# Patient Record
Sex: Male | Born: 1935 | Race: White | Hispanic: No | Marital: Single | Smoking: Former smoker
Health system: Southern US, Community
[De-identification: ages and names within clinical notes are randomized; demographics above are authoritative.]

## PROBLEM LIST (undated history)

## (undated) DIAGNOSIS — N289 Disorder of kidney and ureter, unspecified: Secondary | ICD-10-CM

## (undated) DIAGNOSIS — I714 Abdominal aortic aneurysm, without rupture, unspecified: Secondary | ICD-10-CM

## (undated) DIAGNOSIS — K222 Esophageal obstruction: Secondary | ICD-10-CM

## (undated) DIAGNOSIS — I6529 Occlusion and stenosis of unspecified carotid artery: Secondary | ICD-10-CM

## (undated) DIAGNOSIS — Z720 Tobacco use: Secondary | ICD-10-CM

## (undated) DIAGNOSIS — N400 Enlarged prostate without lower urinary tract symptoms: Secondary | ICD-10-CM

## (undated) DIAGNOSIS — E079 Disorder of thyroid, unspecified: Secondary | ICD-10-CM

## (undated) DIAGNOSIS — N183 Chronic kidney disease, stage 3 unspecified: Secondary | ICD-10-CM

## (undated) DIAGNOSIS — Q231 Congenital insufficiency of aortic valve: Secondary | ICD-10-CM

## (undated) DIAGNOSIS — I219 Acute myocardial infarction, unspecified: Secondary | ICD-10-CM

## (undated) DIAGNOSIS — Q2381 Bicuspid aortic valve: Secondary | ICD-10-CM

## (undated) DIAGNOSIS — E785 Hyperlipidemia, unspecified: Secondary | ICD-10-CM

## (undated) DIAGNOSIS — I2510 Atherosclerotic heart disease of native coronary artery without angina pectoris: Secondary | ICD-10-CM

## (undated) DIAGNOSIS — I10 Essential (primary) hypertension: Secondary | ICD-10-CM

## (undated) HISTORY — DX: Chronic kidney disease, stage 3 unspecified: N18.30

## (undated) HISTORY — DX: Essential (primary) hypertension: I10

## (undated) HISTORY — PX: APPENDECTOMY: SHX54

## (undated) HISTORY — DX: Abdominal aortic aneurysm, without rupture, unspecified: I71.40

## (undated) HISTORY — DX: Tobacco use: Z72.0

## (undated) HISTORY — DX: Bicuspid aortic valve: Q23.81

## (undated) HISTORY — DX: Esophageal obstruction: K22.2

## (undated) HISTORY — PX: CARDIAC CATHETERIZATION: SHX172

## (undated) HISTORY — DX: Occlusion and stenosis of unspecified carotid artery: I65.29

## (undated) HISTORY — DX: Congenital insufficiency of aortic valve: Q23.1

## (undated) HISTORY — DX: Hyperlipidemia, unspecified: E78.5

## (undated) HISTORY — DX: Disorder of thyroid, unspecified: E07.9

## (undated) HISTORY — DX: Atherosclerotic heart disease of native coronary artery without angina pectoris: I25.10

## (undated) HISTORY — DX: Benign prostatic hyperplasia without lower urinary tract symptoms: N40.0

## (undated) HISTORY — DX: Chronic kidney disease, stage 3 (moderate): N18.3

## (undated) HISTORY — PX: CAROTID ENDARTERECTOMY: SUR193

## (undated) HISTORY — DX: Abdominal aortic aneurysm, without rupture: I71.4

## (undated) HISTORY — DX: Disorder of kidney and ureter, unspecified: N28.9

## (undated) HISTORY — DX: Acute myocardial infarction, unspecified: I21.9

---

## 1989-10-06 HISTORY — PX: CORONARY ARTERY BYPASS GRAFT: SHX141

## 2011-10-07 HISTORY — PX: CORONARY ANGIOPLASTY: SHX604

## 2012-08-17 DIAGNOSIS — N410 Acute prostatitis: Secondary | ICD-10-CM | POA: Diagnosis not present

## 2013-04-16 DIAGNOSIS — J449 Chronic obstructive pulmonary disease, unspecified: Secondary | ICD-10-CM | POA: Diagnosis not present

## 2013-04-16 DIAGNOSIS — I10 Essential (primary) hypertension: Secondary | ICD-10-CM | POA: Diagnosis not present

## 2013-04-16 DIAGNOSIS — I509 Heart failure, unspecified: Secondary | ICD-10-CM | POA: Diagnosis not present

## 2013-04-16 DIAGNOSIS — F17200 Nicotine dependence, unspecified, uncomplicated: Secondary | ICD-10-CM | POA: Diagnosis not present

## 2013-04-16 DIAGNOSIS — I4510 Unspecified right bundle-branch block: Secondary | ICD-10-CM | POA: Diagnosis not present

## 2013-04-16 DIAGNOSIS — N179 Acute kidney failure, unspecified: Secondary | ICD-10-CM | POA: Diagnosis not present

## 2013-04-16 DIAGNOSIS — I252 Old myocardial infarction: Secondary | ICD-10-CM | POA: Diagnosis not present

## 2013-04-16 DIAGNOSIS — Z951 Presence of aortocoronary bypass graft: Secondary | ICD-10-CM | POA: Diagnosis not present

## 2013-04-16 DIAGNOSIS — E785 Hyperlipidemia, unspecified: Secondary | ICD-10-CM | POA: Diagnosis not present

## 2013-04-16 DIAGNOSIS — Z79899 Other long term (current) drug therapy: Secondary | ICD-10-CM | POA: Diagnosis not present

## 2013-04-16 DIAGNOSIS — R918 Other nonspecific abnormal finding of lung field: Secondary | ICD-10-CM | POA: Diagnosis not present

## 2013-04-16 DIAGNOSIS — N390 Urinary tract infection, site not specified: Secondary | ICD-10-CM | POA: Diagnosis not present

## 2014-02-23 DIAGNOSIS — R5381 Other malaise: Secondary | ICD-10-CM | POA: Diagnosis not present

## 2014-02-23 DIAGNOSIS — Z9181 History of falling: Secondary | ICD-10-CM | POA: Diagnosis not present

## 2014-02-23 DIAGNOSIS — I10 Essential (primary) hypertension: Secondary | ICD-10-CM | POA: Diagnosis not present

## 2014-02-28 DIAGNOSIS — Z9181 History of falling: Secondary | ICD-10-CM | POA: Diagnosis not present

## 2014-02-28 DIAGNOSIS — R5381 Other malaise: Secondary | ICD-10-CM | POA: Diagnosis not present

## 2014-02-28 DIAGNOSIS — I10 Essential (primary) hypertension: Secondary | ICD-10-CM | POA: Diagnosis not present

## 2014-03-02 DIAGNOSIS — R5381 Other malaise: Secondary | ICD-10-CM | POA: Diagnosis not present

## 2014-03-02 DIAGNOSIS — Z9181 History of falling: Secondary | ICD-10-CM | POA: Diagnosis not present

## 2014-03-02 DIAGNOSIS — I10 Essential (primary) hypertension: Secondary | ICD-10-CM | POA: Diagnosis not present

## 2014-03-07 DIAGNOSIS — I10 Essential (primary) hypertension: Secondary | ICD-10-CM | POA: Diagnosis not present

## 2014-03-07 DIAGNOSIS — R5381 Other malaise: Secondary | ICD-10-CM | POA: Diagnosis not present

## 2014-03-07 DIAGNOSIS — Z9181 History of falling: Secondary | ICD-10-CM | POA: Diagnosis not present

## 2014-03-09 DIAGNOSIS — R5381 Other malaise: Secondary | ICD-10-CM | POA: Diagnosis not present

## 2014-03-09 DIAGNOSIS — Z9181 History of falling: Secondary | ICD-10-CM | POA: Diagnosis not present

## 2014-03-09 DIAGNOSIS — I10 Essential (primary) hypertension: Secondary | ICD-10-CM | POA: Diagnosis not present

## 2014-03-14 DIAGNOSIS — Z9181 History of falling: Secondary | ICD-10-CM | POA: Diagnosis not present

## 2014-03-14 DIAGNOSIS — I10 Essential (primary) hypertension: Secondary | ICD-10-CM | POA: Diagnosis not present

## 2014-03-14 DIAGNOSIS — R5381 Other malaise: Secondary | ICD-10-CM | POA: Diagnosis not present

## 2014-03-16 DIAGNOSIS — I10 Essential (primary) hypertension: Secondary | ICD-10-CM | POA: Diagnosis not present

## 2014-03-16 DIAGNOSIS — R5381 Other malaise: Secondary | ICD-10-CM | POA: Diagnosis not present

## 2014-03-16 DIAGNOSIS — Z9181 History of falling: Secondary | ICD-10-CM | POA: Diagnosis not present

## 2014-03-21 DIAGNOSIS — I10 Essential (primary) hypertension: Secondary | ICD-10-CM | POA: Diagnosis not present

## 2014-03-21 DIAGNOSIS — Z9181 History of falling: Secondary | ICD-10-CM | POA: Diagnosis not present

## 2014-03-21 DIAGNOSIS — R5381 Other malaise: Secondary | ICD-10-CM | POA: Diagnosis not present

## 2014-03-23 DIAGNOSIS — Z9181 History of falling: Secondary | ICD-10-CM | POA: Diagnosis not present

## 2014-03-23 DIAGNOSIS — I10 Essential (primary) hypertension: Secondary | ICD-10-CM | POA: Diagnosis not present

## 2014-03-23 DIAGNOSIS — R5381 Other malaise: Secondary | ICD-10-CM | POA: Diagnosis not present

## 2014-03-28 DIAGNOSIS — Z9181 History of falling: Secondary | ICD-10-CM | POA: Diagnosis not present

## 2014-03-28 DIAGNOSIS — I10 Essential (primary) hypertension: Secondary | ICD-10-CM | POA: Diagnosis not present

## 2014-03-28 DIAGNOSIS — R5381 Other malaise: Secondary | ICD-10-CM | POA: Diagnosis not present

## 2014-03-31 DIAGNOSIS — Z9181 History of falling: Secondary | ICD-10-CM | POA: Diagnosis not present

## 2014-03-31 DIAGNOSIS — I10 Essential (primary) hypertension: Secondary | ICD-10-CM | POA: Diagnosis not present

## 2014-03-31 DIAGNOSIS — R5381 Other malaise: Secondary | ICD-10-CM | POA: Diagnosis not present

## 2014-09-21 ENCOUNTER — Encounter: Payer: Self-pay | Admitting: Cardiovascular Disease

## 2014-09-21 ENCOUNTER — Inpatient Hospital Stay: Payer: Self-pay | Admitting: Cardiovascular Disease

## 2014-09-21 ENCOUNTER — Other Ambulatory Visit: Payer: Self-pay | Admitting: Physician Assistant

## 2014-09-21 DIAGNOSIS — I2510 Atherosclerotic heart disease of native coronary artery without angina pectoris: Secondary | ICD-10-CM | POA: Diagnosis present

## 2014-09-21 DIAGNOSIS — Z66 Do not resuscitate: Secondary | ICD-10-CM | POA: Diagnosis not present

## 2014-09-21 DIAGNOSIS — I129 Hypertensive chronic kidney disease with stage 1 through stage 4 chronic kidney disease, or unspecified chronic kidney disease: Secondary | ICD-10-CM | POA: Diagnosis not present

## 2014-09-21 DIAGNOSIS — I350 Nonrheumatic aortic (valve) stenosis: Secondary | ICD-10-CM

## 2014-09-21 DIAGNOSIS — I2111 ST elevation (STEMI) myocardial infarction involving right coronary artery: Secondary | ICD-10-CM | POA: Diagnosis not present

## 2014-09-21 DIAGNOSIS — Z720 Tobacco use: Secondary | ICD-10-CM | POA: Diagnosis not present

## 2014-09-21 DIAGNOSIS — I2119 ST elevation (STEMI) myocardial infarction involving other coronary artery of inferior wall: Secondary | ICD-10-CM | POA: Diagnosis not present

## 2014-09-21 DIAGNOSIS — Z716 Tobacco abuse counseling: Secondary | ICD-10-CM | POA: Diagnosis not present

## 2014-09-21 DIAGNOSIS — N400 Enlarged prostate without lower urinary tract symptoms: Secondary | ICD-10-CM | POA: Diagnosis present

## 2014-09-21 DIAGNOSIS — I213 ST elevation (STEMI) myocardial infarction of unspecified site: Secondary | ICD-10-CM | POA: Diagnosis not present

## 2014-09-21 DIAGNOSIS — I714 Abdominal aortic aneurysm, without rupture: Secondary | ICD-10-CM | POA: Diagnosis present

## 2014-09-21 DIAGNOSIS — Z8249 Family history of ischemic heart disease and other diseases of the circulatory system: Secondary | ICD-10-CM | POA: Diagnosis not present

## 2014-09-21 DIAGNOSIS — Z7982 Long term (current) use of aspirin: Secondary | ICD-10-CM | POA: Diagnosis not present

## 2014-09-21 DIAGNOSIS — I25810 Atherosclerosis of coronary artery bypass graft(s) without angina pectoris: Secondary | ICD-10-CM | POA: Diagnosis present

## 2014-09-21 DIAGNOSIS — F17210 Nicotine dependence, cigarettes, uncomplicated: Secondary | ICD-10-CM | POA: Diagnosis present

## 2014-09-21 DIAGNOSIS — E039 Hypothyroidism, unspecified: Secondary | ICD-10-CM | POA: Diagnosis not present

## 2014-09-21 DIAGNOSIS — I10 Essential (primary) hypertension: Secondary | ICD-10-CM

## 2014-09-21 DIAGNOSIS — N183 Chronic kidney disease, stage 3 (moderate): Secondary | ICD-10-CM | POA: Diagnosis not present

## 2014-09-21 DIAGNOSIS — Z01810 Encounter for preprocedural cardiovascular examination: Secondary | ICD-10-CM | POA: Diagnosis not present

## 2014-09-21 DIAGNOSIS — E785 Hyperlipidemia, unspecified: Secondary | ICD-10-CM | POA: Diagnosis not present

## 2014-09-21 LAB — CBC WITH DIFFERENTIAL/PLATELET
BASOS PCT: 0.7 %
Basophil #: 0.1 10*3/uL (ref 0.0–0.1)
EOS ABS: 0.1 10*3/uL (ref 0.0–0.7)
Eosinophil %: 1.6 %
HCT: 40.9 % (ref 40.0–52.0)
HGB: 13.4 g/dL (ref 13.0–18.0)
Lymphocyte #: 1.4 10*3/uL (ref 1.0–3.6)
Lymphocyte %: 18 %
MCH: 31.4 pg (ref 26.0–34.0)
MCHC: 32.9 g/dL (ref 32.0–36.0)
MCV: 96 fL (ref 80–100)
Monocyte #: 0.5 x10 3/mm (ref 0.2–1.0)
Monocyte %: 6 %
NEUTROS PCT: 73.7 %
Neutrophil #: 5.9 10*3/uL (ref 1.4–6.5)
PLATELETS: 162 10*3/uL (ref 150–440)
RBC: 4.28 10*6/uL — AB (ref 4.40–5.90)
RDW: 13.8 % (ref 11.5–14.5)
WBC: 8 10*3/uL (ref 3.8–10.6)

## 2014-09-21 LAB — TROPONIN I
Troponin-I: 0.48 ng/mL — ABNORMAL HIGH
Troponin-I: 13 ng/mL — ABNORMAL HIGH
Troponin-I: 17 ng/mL — ABNORMAL HIGH

## 2014-09-21 LAB — CK TOTAL AND CKMB (NOT AT ARMC)
CK, Total: 47 U/L (ref 39–308)
CK, Total: 298 U/L (ref 39–308)
CK, Total: 372 U/L — ABNORMAL HIGH (ref 39–308)
CK-MB: 2.9 ng/mL (ref 0.5–3.6)
CK-MB: 32 ng/mL — ABNORMAL HIGH (ref 0.5–3.6)
CK-MB: 43.3 ng/mL — AB (ref 0.5–3.6)

## 2014-09-21 LAB — CK-MB: CK-MB: 38.4 ng/mL — AB (ref 0.5–3.6)

## 2014-09-21 LAB — BASIC METABOLIC PANEL
ANION GAP: 11 (ref 7–16)
BUN: 31 mg/dL — ABNORMAL HIGH (ref 7–18)
CALCIUM: 9.1 mg/dL (ref 8.5–10.1)
CHLORIDE: 105 mmol/L (ref 98–107)
Co2: 23 mmol/L (ref 21–32)
Creatinine: 1.78 mg/dL — ABNORMAL HIGH (ref 0.60–1.30)
EGFR (African American): 47 — ABNORMAL LOW
EGFR (Non-African Amer.): 39 — ABNORMAL LOW
Glucose: 128 mg/dL — ABNORMAL HIGH (ref 65–99)
Osmolality: 286 (ref 275–301)
Potassium: 4.1 mmol/L (ref 3.5–5.1)
Sodium: 139 mmol/L (ref 136–145)

## 2014-09-21 LAB — HEMOGLOBIN A1C: HEMOGLOBIN A1C: 5.1 % (ref 4.2–6.3)

## 2014-09-22 ENCOUNTER — Encounter: Payer: Self-pay | Admitting: Cardiovascular Disease

## 2014-09-22 LAB — BASIC METABOLIC PANEL
Anion Gap: 7 (ref 7–16)
BUN: 30 mg/dL — ABNORMAL HIGH (ref 7–18)
CO2: 26 mmol/L (ref 21–32)
CREATININE: 1.54 mg/dL — AB (ref 0.60–1.30)
Calcium, Total: 7.8 mg/dL — ABNORMAL LOW (ref 8.5–10.1)
Chloride: 106 mmol/L (ref 98–107)
EGFR (African American): 56 — ABNORMAL LOW
EGFR (Non-African Amer.): 46 — ABNORMAL LOW
Glucose: 100 mg/dL — ABNORMAL HIGH (ref 65–99)
Osmolality: 284 (ref 275–301)
Potassium: 3.9 mmol/L (ref 3.5–5.1)
Sodium: 139 mmol/L (ref 136–145)

## 2014-09-22 LAB — LIPID PANEL
CHOLESTEROL: 119 mg/dL (ref 0–200)
HDL: 32 mg/dL — AB (ref 40–60)
LDL CHOLESTEROL, CALC: 68 mg/dL (ref 0–100)
Triglycerides: 97 mg/dL (ref 0–200)
VLDL Cholesterol, Calc: 19 mg/dL (ref 5–40)

## 2014-09-22 LAB — CK-MB: CK-MB: 54 ng/mL — AB (ref 0.5–3.6)

## 2014-09-25 ENCOUNTER — Telehealth: Payer: Self-pay

## 2014-09-25 ENCOUNTER — Encounter: Payer: Self-pay | Admitting: Cardiovascular Disease

## 2014-09-25 NOTE — Telephone Encounter (Signed)
Pt daughter called, states she had a question regarding pt BP medication that he received when he was discharged.

## 2014-09-25 NOTE — Telephone Encounter (Signed)
Patient contacted regarding discharge from [facility] on [facility]1914.  Patient understands to follow up with Eula Listenyan Dunn, PA on 10/09/14 at 1:45 at [facility]. Patient understands discharge instructions? yes Patient understands medications and regiment? yes Patient understands to bring all medications to this visit? yes  Pt's daughter wanted to clarify pt's lisinopril dose, as the bottle says once daily, but her d/c instructions say every other day.  Advised her that the discharge summary that I see indicates QD dosing, but to monitor pt's BP. She is agreeable to this and will keep scheduled appt w/ Ryan.

## 2014-09-25 NOTE — Telephone Encounter (Signed)
Daughter's name is Shyrl NumbersJennifer Hurst

## 2014-09-27 ENCOUNTER — Encounter: Payer: Self-pay | Admitting: Cardiovascular Disease

## 2014-10-09 ENCOUNTER — Ambulatory Visit (INDEPENDENT_AMBULATORY_CARE_PROVIDER_SITE_OTHER): Payer: Medicare Other | Admitting: Physician Assistant

## 2014-10-09 ENCOUNTER — Ambulatory Visit: Payer: Self-pay | Admitting: Physician Assistant

## 2014-10-09 ENCOUNTER — Encounter: Payer: Self-pay | Admitting: Physician Assistant

## 2014-10-09 VITALS — BP 184/78 | HR 54 | Ht 64.0 in | Wt 141.2 lb

## 2014-10-09 DIAGNOSIS — I10 Essential (primary) hypertension: Secondary | ICD-10-CM | POA: Diagnosis not present

## 2014-10-09 DIAGNOSIS — N183 Chronic kidney disease, stage 3 unspecified: Secondary | ICD-10-CM

## 2014-10-09 DIAGNOSIS — I25810 Atherosclerosis of coronary artery bypass graft(s) without angina pectoris: Secondary | ICD-10-CM | POA: Diagnosis not present

## 2014-10-09 DIAGNOSIS — I872 Venous insufficiency (chronic) (peripheral): Secondary | ICD-10-CM | POA: Diagnosis not present

## 2014-10-09 DIAGNOSIS — K222 Esophageal obstruction: Secondary | ICD-10-CM | POA: Insufficient documentation

## 2014-10-09 DIAGNOSIS — Z9889 Other specified postprocedural states: Secondary | ICD-10-CM | POA: Diagnosis not present

## 2014-10-09 DIAGNOSIS — I714 Abdominal aortic aneurysm, without rupture, unspecified: Secondary | ICD-10-CM

## 2014-10-09 DIAGNOSIS — E785 Hyperlipidemia, unspecified: Secondary | ICD-10-CM | POA: Insufficient documentation

## 2014-10-09 DIAGNOSIS — I6529 Occlusion and stenosis of unspecified carotid artery: Secondary | ICD-10-CM | POA: Insufficient documentation

## 2014-10-09 DIAGNOSIS — I6522 Occlusion and stenosis of left carotid artery: Secondary | ICD-10-CM

## 2014-10-09 DIAGNOSIS — I2510 Atherosclerotic heart disease of native coronary artery without angina pectoris: Secondary | ICD-10-CM | POA: Insufficient documentation

## 2014-10-09 DIAGNOSIS — Z720 Tobacco use: Secondary | ICD-10-CM | POA: Insufficient documentation

## 2014-10-09 DIAGNOSIS — Q231 Congenital insufficiency of aortic valve: Secondary | ICD-10-CM | POA: Insufficient documentation

## 2014-10-09 LAB — BASIC METABOLIC PANEL
ANION GAP: 6 — AB (ref 7–16)
BUN: 30 mg/dL — ABNORMAL HIGH (ref 7–18)
CALCIUM: 9 mg/dL (ref 8.5–10.1)
CHLORIDE: 111 mmol/L — AB (ref 98–107)
CO2: 21 mmol/L (ref 21–32)
Creatinine: 1.42 mg/dL — ABNORMAL HIGH (ref 0.60–1.30)
EGFR (Non-African Amer.): 51 — ABNORMAL LOW
Glucose: 91 mg/dL (ref 65–99)
OSMOLALITY: 281 (ref 275–301)
Potassium: 4.9 mmol/L (ref 3.5–5.1)
Sodium: 138 mmol/L (ref 136–145)

## 2014-10-09 MED ORDER — AMLODIPINE BESYLATE 10 MG PO TABS: 10.0000 mg | ORAL_TABLET | Freq: Every day | ORAL | Status: DC

## 2014-10-09 MED ORDER — LISINOPRIL 5 MG PO TABS: 5.0000 mg | ORAL_TABLET | Freq: Every day | ORAL | Status: DC

## 2014-10-09 MED ORDER — CARVEDILOL 3.125 MG PO TABS: 3.1250 mg | ORAL_TABLET | Freq: Two times a day (BID) | ORAL | Status: DC

## 2014-10-09 MED ORDER — CLOPIDOGREL BISULFATE 75 MG PO TABS: 75.0000 mg | ORAL_TABLET | Freq: Every day | ORAL | Status: AC

## 2014-10-09 MED ORDER — AMLODIPINE BESYLATE 5 MG PO TABS: 5.0000 mg | ORAL_TABLET | Freq: Every day | ORAL | Status: DC

## 2014-10-09 MED ORDER — NITROGLYCERIN 0.4 MG SL SUBL: 0.4000 mg | SUBLINGUAL_TABLET | SUBLINGUAL | Status: AC | PRN

## 2014-10-09 NOTE — Progress Notes (Signed)
Patient Name: Daniel Huerta, [name] Dec 26, 1928, MRN [id number]  Date of Encounter: 10/09/2014  Primary Care Provider:  [facility] in [place], [place]  Primary Cardiologist:  Dr. Kirke Corin, MD  Patient Profile:  81 y.o. male with history below presents for hospital follow up after recent admission to [facility] from 12/17-12/19 for inferior STEMI s/p PCI/DES x2 to distal third of the saphenous vein graft from the aorta to the distal RCA and middle third of the saphenous vein graft from the aorta to the distal RCA.    Problem List:   Past Medical History  Diagnosis Date  . AAA (abdominal aortic aneurysm)     a. 4.4 x 4.5 cm on 04/27/2014  . Hypertension   . Hyperlipidemia   . BPH (benign prostatic hypertrophy)   . Coronary artery disease     a. 2v CABG 1991 (LIMA-LAD, SVG-RCA), stenting LCx 2013; b. inferior STEMI 09/21/2014 cath:  LIMA-LAD is atretic w/ 90% ost LAD, acute occlusion SVG-RCA s/p PCI/DES x 2, patent LCx stent   . Acute renal insufficiency   . Tobacco abuse   . CKD (chronic kidney disease), stage III   . Bicuspid aortic valve     a. echo 09/21/14: EF 55-60%, Antseptal wall motion abn 2/2 prior CABG, images suggestive of bicuspid aortic valve, mild dilation of aortic root (3.6 cm) & ascending aorta, mild MR, impaired relaxation  . MI (myocardial infarction)     x3  . Thyroid disease   . Carotid artery occlusion   . Esophageal stricture     a. s/p multiple dilatations   Past Surgical History  Procedure Laterality Date  . Coronary artery bypass graft  1991    CABG x 2   . Carotid endarterectomy    . Cardiac catheterization    . Coronary angioplasty  2013    s/p stenting to LCX    . Cardiac catheterization      x2 [facility] [place]  . Cardiac catheterization      x2 [facility] [place]  . Appendectomy       Allergies:  Allergies  Allergen Reactions  . Cerivastatin   . Morphine     Other reaction(s): Other - See  Comments Causes hiccups     HPI:  81 y.o. male with the above problem list presents for hospital follow up. Patient is with history of CAD s/p 2 vessel CABG in 1991 (LIMA-LAD & SVG-RCA) s/p stenting to LCx in 2013. Records are not on hand for review as his primary cardiologist is through the [facility]. He also reports having a AAA which measures approximately 4.4 x 4.5 cm on 04/27/2014. He is in town from [place], [place] visiting his daughter. He was in his usual state of health on the morning of 09/21/2014 sitting down relaxing until he suddenly developed substernal chest pain that radiated to his left arm and wrist. No associated diaphoresis, nausea, vomting, SOB, presyncope, or syncope. He took 3 SL NTG without any help in symptoms.   His daughter brought him to [facility] Upon his arrival to [facility] he was noted to have an inferior ST elevation MI. EKG with with NSR, 65, inferior ST elevation with reciprocal changes, PVC and fusion complexes. Troponin peak of 17.00. He was taken emergently to the cardiac cath lab where Dr. Kirke Corin performed cardiac catheterization which showed 90% atretic ostial LAD stenosis, patent LCx stent, and acute occluded SVG-->RCA s/p PCI/DES x 2. He did well post  cath, without any issues. He is a fairly active guy at baseline. No recent weight gain. No orthopnea. No early satiety.   He has felt well since his discharge. He has taken it easy at his daughter's house, not doing any activities. No chest pain, chest tightness, or SOB. He does eat a large amount of salt on all of his foods. His family has had multiple discussions with him regarding this, though he continues to place large amounts of salt on his foods. He does not drink much water daily. His weight has been stable for the most part. Some swelling the ankles, though this is not new. He is taking his medications as directed. He has not been checking his BP at home until this morning when it was 140/65 with a pulse of 58. He  felt fine. He wants to go back to his home in [place], [place]. He does not have a cardiologist there.    Home Medications:  Prior to Admission medications   Medication Sig Start Date End Date Taking? Authorizing Provider  amLODipine (NORVASC) 5 MG tablet Take 5 mg by mouth daily.    Historical Provider, MD  aspirin 81 MG tablet Take 81 mg by mouth daily.    Historical Provider, MD  carvedilol (COREG) 3.125 MG tablet Take 3.125 mg by mouth 2 (two) times daily with a meal.    Historical Provider, MD  levothyroxine (SYNTHROID, LEVOTHROID) 25 MCG tablet Take 25 mcg by mouth daily before breakfast.    Historical Provider, MD  lisinopril (PRINIVIL,ZESTRIL) 5 MG tablet Take 5 mg by mouth daily.    Historical Provider, MD  nitroGLYCERIN (NITROSTAT) 0.4 MG SL tablet Place 0.4 mg under the tongue every 5 (five) minutes as needed for chest pain.    Historical Provider, MD  pravastatin (PRAVACHOL) 40 MG tablet Take 40 mg by mouth daily.    Historical Provider, MD  terazosin (HYTRIN) 2 MG capsule Take 2 mg by mouth at bedtime.    Historical Provider, MD  ticagrelor (BRILINTA) 90 MG TABS tablet Take by mouth 2 (two) times daily.    Historical Provider, MD  traZODone (DESYREL) 50 MG tablet Take 50 mg by mouth at bedtime.    Historical Provider, MD     Weights: Filed Weights   10/09/14 1355  Weight: 141 lb 4 oz (64.071 kg)     Review of Systems:  As above. All other systems reviewed and are otherwise negative except as noted above.  Physical Exam:  Blood pressure 184/78, pulse 54, height  (1.626 m), weight 141 lb 4 oz (64.071 kg).  General: Pleasant, NAD Psych: Normal affect. Neuro: Alert and oriented X 3. Moves all extremities spontaneously. HEENT: Normal  Neck: Supple without bruits or JVD. Lungs:  Resp regular and unlabored, CTA. Heart: RRR no s3, s4, or murmurs. Abdomen: Soft, non-tender, non-distended, BS + x 4.  Extremities: No clubbing, cyanosis, 2+ pitting edema to just  below the bilateral knees with mildly erythematous rash.    Accessory Clinical Findings:  EKG - sinus bradycardia with sinus arrhythmia, 54 bpm, LVH, normal axis, st/t changes c/w prior inferior infarct   Assessment & Plan:  1. CAD s/p 2 vessel CABG (LIMA-LAD, SVG-RCA), s/p LCx stenting 2013, with history of inferior STEMI 09/21/2014 s/p PCI/DES x 2 to SVG-RCA as above with residual ostial LAD disease: -Continue aspirin and Brilinta for at least 12 months (may need to change to Plavix) -Schedule outpatient [facility] for planned PCI of ostial LAD stenosis  -  Continue Coreg 3.125  -Lipitor 40 mg  2. HTN: -Reported history of labile HTN -BP at home this morning was 140/65 -Increase amlodipine to 10 mg daily  -Continue lisinopril 5 mg, Coreg 3.125 mg bid, and clonidine 0.1 mg bid  3. History of acute on CKD stage III: -Improved with hydration while inpatient -Check bmet to verify SCr is stable/improved (1.54 at discharge)   4. HLD: -TC 119, LDL 68, HDL 32 TG 97 -Continue Lipitor 40 mg  5. AAA: -Prior study from 04/27/2014 indicated AAA measured at 4.4 x 4.5 cm (no prior to compare to) -Recommend repeat study to trend  6. Venous insufficiency: -Patient eats large amounts of salt, discussed with him in detail this must stop and he agrees to do so. Should he make this lifestyle adjustment and his lower extremity edema persist would adjust his amlodipine (however given his labile HTN and massive salt consumption I felt this was the better option initially). -Compression hose -Lasix 20 mg daily -Bmet as above  7. Dispo: -Follow up 1 month -Plan for outpatient cardiac cath with Dr. Kirke Corin, possibly in 1 week   Eula Listen, PA-C [facility] [address] [place], [place] [address] [phone] [facility] 10/09/2014, 3:59 PM

## 2014-10-09 NOTE — Patient Instructions (Signed)
We will check labs today:  BMET  Please increase your amlodipine to 10 mg once a day  Please start Plavix 75 mg once a day  Your physician has requested that you have an abdominal aorta duplex. During this test, an ultrasound is used to evaluate the aorta. Allow 30 minutes for this exam. Do not eat after midnight the day before and avoid carbonated beverages  Your physician recommends that you schedule a follow-up appointment in: 1 month

## 2014-10-10 ENCOUNTER — Telehealth: Payer: Self-pay | Admitting: Physician Assistant

## 2014-10-10 NOTE — Telephone Encounter (Signed)
Pt granddaughter called asking about blood work. Please call patient with those.

## 2014-10-10 NOTE — Telephone Encounter (Signed)
Results in Ryan's basket for review.  Please see result note.

## 2014-10-11 ENCOUNTER — Encounter: Payer: Self-pay | Admitting: Physician Assistant

## 2014-10-11 ENCOUNTER — Telehealth: Payer: Self-pay | Admitting: *Deleted

## 2014-10-11 DIAGNOSIS — I10 Essential (primary) hypertension: Secondary | ICD-10-CM

## 2014-10-11 DIAGNOSIS — Z01812 Encounter for preprocedural laboratory examination: Secondary | ICD-10-CM

## 2014-10-11 MED ORDER — FUROSEMIDE 20 MG PO TABS: 20.0000 mg | ORAL_TABLET | Freq: Every day | ORAL | Status: AC

## 2014-10-11 NOTE — Telephone Encounter (Signed)
Error

## 2014-10-11 NOTE — Telephone Encounter (Signed)
Informed patients daughter of Daniel HatchetRyan Dunns response  Scheduled appt for labs  PAtients daughter verbalized understanding

## 2014-10-11 NOTE — Telephone Encounter (Signed)
-----   Message from Sondra Bargesyan M Dunn, [place] sent at 10/11/2014  4:17 PM EST ----- Looks like his Lasix didn't make it to the pharmacy Let's have him take Lasix 20 mg bid for the next 3 days, followed by 20 mg daily with recheck pre-cath labs on Monday in hopes that he will be doing better at that time and we can schedule cath then

## 2014-10-16 ENCOUNTER — Other Ambulatory Visit (INDEPENDENT_AMBULATORY_CARE_PROVIDER_SITE_OTHER): Payer: Medicare Other

## 2014-10-16 DIAGNOSIS — Q231 Congenital insufficiency of aortic valve: Secondary | ICD-10-CM

## 2014-10-16 DIAGNOSIS — I10 Essential (primary) hypertension: Secondary | ICD-10-CM

## 2014-10-16 DIAGNOSIS — Z01812 Encounter for preprocedural laboratory examination: Secondary | ICD-10-CM

## 2014-10-16 DIAGNOSIS — I714 Abdominal aortic aneurysm, without rupture: Secondary | ICD-10-CM | POA: Diagnosis not present

## 2014-10-17 ENCOUNTER — Telehealth: Payer: Self-pay | Admitting: *Deleted

## 2014-10-17 LAB — BASIC METABOLIC PANEL
BUN/Creatinine Ratio: 24 — ABNORMAL HIGH (ref 10–22)
BUN: 48 mg/dL — AB (ref 8–27)
CALCIUM: 8.6 mg/dL (ref 8.6–10.2)
CO2: 20 mmol/L (ref 18–29)
Chloride: 102 mmol/L (ref 97–108)
Creatinine, Ser: 1.98 mg/dL — ABNORMAL HIGH (ref 0.76–1.27)
GFR, EST AFRICAN AMERICAN: 35 mL/min/{1.73_m2} — AB (ref >59)
GFR, EST NON AFRICAN AMERICAN: 30 mL/min/{1.73_m2} — AB (ref >59)
GLUCOSE: 123 mg/dL — AB (ref 65–99)
POTASSIUM: 4.8 mmol/L (ref 3.5–5.2)
Sodium: 138 mmol/L (ref 134–144)

## 2014-10-17 LAB — CBC WITH DIFFERENTIAL
BASOS ABS: 0.1 10*3/uL (ref 0.0–0.2)
Basos: 1 %
Eos: 5 %
Eosinophils Absolute: 0.3 10*3/uL (ref 0.0–0.4)
HCT: 33.1 % — ABNORMAL LOW (ref 37.5–51.0)
HEMOGLOBIN: 11.6 g/dL — AB (ref 12.6–17.7)
Immature Grans (Abs): 0 10*3/uL (ref 0.0–0.1)
Immature Granulocytes: 0 %
Lymphocytes Absolute: 1 10*3/uL (ref 0.7–3.1)
Lymphs: 16 %
MCH: 31.6 pg (ref 26.6–33.0)
MCHC: 35 g/dL (ref 31.5–35.7)
MCV: 90 fL (ref 79–97)
MONOCYTES: 9 %
Monocytes Absolute: 0.6 10*3/uL (ref 0.1–0.9)
NEUTROS ABS: 4.3 10*3/uL (ref 1.4–7.0)
Neutrophils Relative %: 69 %
Platelets: 142 10*3/uL — ABNORMAL LOW (ref 150–379)
RBC: 3.67 x10E6/uL — ABNORMAL LOW (ref 4.14–5.80)
RDW: 13.9 % (ref 12.3–15.4)
WBC: 6.3 10*3/uL (ref 3.4–10.8)

## 2014-10-17 LAB — PROTIME-INR
INR: 1 (ref 0.8–1.2)
Prothrombin Time: 10.7 s (ref 9.1–12.0)

## 2014-10-17 NOTE — Telephone Encounter (Signed)
Informed patients daughter that creatinine is elevated  Per Dr. Kirke CorinArida we can not do cath tomorrow  F/u early next week

## 2014-10-17 NOTE — Telephone Encounter (Signed)
-----   Message from Sondra Bargesyan M Dunn, PA-C sent at 10/17/2014 11:11 AM EST ----- Please call the patient. -SCr has worsened (1.42-->1.98) -Please contact Dr. Kirke CorinArida and ask if he is ok proceeding with cath [facility] or if he would like to reschedule

## 2014-10-18 NOTE — Telephone Encounter (Signed)
Called patient spoke to [place] pt daughter and scheduled an apt Monday 18th at 2:45.

## 2014-10-19 ENCOUNTER — Encounter: Payer: Self-pay | Admitting: Physician Assistant

## 2014-10-19 ENCOUNTER — Encounter (INDEPENDENT_AMBULATORY_CARE_PROVIDER_SITE_OTHER): Payer: Medicare Other

## 2014-10-19 DIAGNOSIS — I10 Essential (primary) hypertension: Secondary | ICD-10-CM

## 2014-10-19 DIAGNOSIS — I714 Abdominal aortic aneurysm, without rupture, unspecified: Secondary | ICD-10-CM

## 2014-10-19 DIAGNOSIS — N183 Chronic kidney disease, stage 3 (moderate): Secondary | ICD-10-CM | POA: Diagnosis not present

## 2014-10-19 DIAGNOSIS — I700 Atherosclerosis of aorta: Secondary | ICD-10-CM

## 2014-10-23 ENCOUNTER — Telehealth: Payer: Self-pay | Admitting: *Deleted

## 2014-10-23 ENCOUNTER — Encounter: Payer: Self-pay | Admitting: Cardiovascular Disease

## 2014-10-23 ENCOUNTER — Ambulatory Visit (INDEPENDENT_AMBULATORY_CARE_PROVIDER_SITE_OTHER): Payer: Medicare Other | Admitting: Cardiovascular Disease

## 2014-10-23 VITALS — BP 130/70 | HR 60 | Ht 64.0 in | Wt 138.2 lb

## 2014-10-23 DIAGNOSIS — I714 Abdominal aortic aneurysm, without rupture, unspecified: Secondary | ICD-10-CM

## 2014-10-23 DIAGNOSIS — I6522 Occlusion and stenosis of left carotid artery: Secondary | ICD-10-CM

## 2014-10-23 DIAGNOSIS — I10 Essential (primary) hypertension: Secondary | ICD-10-CM

## 2014-10-23 DIAGNOSIS — I2510 Atherosclerotic heart disease of native coronary artery without angina pectoris: Secondary | ICD-10-CM | POA: Diagnosis not present

## 2014-10-23 MED ORDER — AMLODIPINE BESYLATE 5 MG PO TABS: 5.0000 mg | ORAL_TABLET | Freq: Every day | ORAL | Status: AC

## 2014-10-23 MED ORDER — LISINOPRIL 5 MG PO TABS: 5.0000 mg | ORAL_TABLET | Freq: Every day | ORAL | Status: AC

## 2014-10-23 MED ORDER — CARVEDILOL 3.125 MG PO TABS: 3.1250 mg | ORAL_TABLET | Freq: Two times a day (BID) | ORAL | Status: AC

## 2014-10-23 MED ORDER — PRAVASTATIN SODIUM 80 MG PO TABS: 80.0000 mg | ORAL_TABLET | Freq: Every day | ORAL | Status: AC

## 2014-10-23 NOTE — Progress Notes (Signed)
HPI  This is an 81 year old man who is here today for a follow-up visit regarding coronary artery disease. Patient is with history of CAD s/p 2 vessel CABG in 1991 (LIMA-LAD & SVG-RCA) s/p stenting to LCx in 2013. Records are not on hand for review as his primary cardiologist is through the [facility]. He also reports having a AAA which measures approximately 4.4 x 4.5 cm on 04/27/2014. He is in town from [place], [place] visiting his daughter. He was in his usual state of health on the morning of 09/21/2014 sitting down relaxing until he suddenly developed substernal chest pain that radiated to his left arm and wrist. No associated diaphoresis, nausea, vomting, SOB, presyncope, or syncope. He took 3 SL NTG without any help in symptoms.   His daughter brought him to [facility]. Upon his arrival to [facility] he was noted to have an inferior ST elevation MI. EKG with with NSR, 65, inferior ST elevation with reciprocal changes, PVC and fusion complexes. Troponin peak of 17.00. He was taken emergently to the cardiac cath lab where I performed cardiac catheterization which showed 90% ostial LAD stenosis, patent LCx stent, and acute occluded SVG-->RCA s/p PCI/DES x 2. He did well post cath, without any issues. He is a fairly active guy at baseline.  He has been doing reasonably well since hospital discharge and denies any recurrent chest pain. The plan was to proceed with staged LAD PCI. However, he does have chronic kidney disease and given the lack of anginal symptoms I elected not to proceed. He is going back to his home in [place].   Allergies  Allergen Reactions  . Cerivastatin   . Morphine     Other reaction(s): Other - See Comments Causes hiccups     Current Outpatient Prescriptions on File Prior to Visit  Medication Sig Dispense Refill  . amLODipine (NORVASC) 10 MG tablet Take 1 tablet (10 mg total) by mouth daily. 30 tablet 6  . aspirin 81 MG tablet Take 81 mg by mouth daily.    . carvedilol  (COREG) 3.125 MG tablet Take 1 tablet (3.125 mg total) by mouth 2 (two) times daily with a meal. 60 tablet 6  . clopidogrel (PLAVIX) 75 MG tablet Take 1 tablet (75 mg total) by mouth daily. 30 tablet 6  . furosemide (LASIX) 20 MG tablet Take 1 tablet (20 mg total) by mouth daily. 30 tablet 6  . levothyroxine (SYNTHROID, LEVOTHROID) 25 MCG tablet Take 25 mcg by mouth daily before breakfast.    . lisinopril (PRINIVIL,ZESTRIL) 5 MG tablet Take 1 tablet (5 mg total) by mouth daily. 30 tablet 6  . nitroGLYCERIN (NITROSTAT) 0.4 MG SL tablet Place 1 tablet (0.4 mg total) under the tongue every 5 (five) minutes as needed for chest pain. 25 tablet 6  . pravastatin (PRAVACHOL) 80 MG tablet Take 80 mg by mouth daily.    Marland Kitchen terazosin (HYTRIN) 2 MG capsule Take 2 mg by mouth at bedtime.    . traZODone (DESYREL) 50 MG tablet Take 50 mg by mouth at bedtime.     No current facility-administered medications on file prior to visit.     Past Medical History  Diagnosis Date  . AAA (abdominal aortic aneurysm)     a. 4.4 x 4.5 cm on 04/27/2014; b. abd [place] 10/19/2014: 4.7 cm x 4.8 cm distal aorta, with mod thrombus & plaque burden. Heavily calcified aorto-iliac arteries, w/o stenosis, w/ ectatic abd aorta. Follow up 6 months (04/2015).     Marland Kitchen  Hypertension   . Hyperlipidemia   . BPH (benign prostatic hypertrophy)   . Coronary artery disease     a. 2v CABG 1991 (LIMA-LAD, SVG-RCA), stenting LCx 2013; b. inferior STEMI 09/21/2014 cath:  LIMA-LAD is atretic w/ 90% ost LAD, acute occlusion SVG-RCA s/p PCI/DES x 2, patent LCx stent   . Acute renal insufficiency   . Tobacco abuse   . CKD (chronic kidney disease), stage III   . Bicuspid aortic valve     a. echo 09/21/14: EF 55-60%, Antseptal wall motion abn 2/2 prior CABG, images suggestive of bicuspid aortic valve, mild dilation of aortic root (3.6 cm) & ascending aorta, mild MR, impaired relaxation  . MI (myocardial infarction)     x3  . Thyroid disease   . Carotid  artery occlusion   . Esophageal stricture     a. s/p multiple dilatations     Past Surgical History  Procedure Laterality Date  . Coronary artery bypass graft  1991    CABG x 2   . Carotid endarterectomy    . Cardiac catheterization    . Coronary angioplasty  2013    s/p stenting to LCX    . Cardiac catheterization      [facility]2 [facility] [place]  . Cardiac catheterization      [facility]2 [facility] [place]  . Appendectomy       Family History  Problem Relation Age of Onset  . Heart failure Mother   . Heart failure Father      History   Social History  . Marital Status: Single    Spouse Name: N/A    Number of Children: N/A  . Years of Education: N/A   Occupational History  . Not on file.   Social History Main Topics  . Smoking status: Former Smoker -- 65 years    Types: Cigarettes  . Smokeless tobacco: Not on file  . Alcohol Use: No  . Drug Use: No  . Sexual Activity: Not on file   Other Topics Concern  . Not on file   Social History Narrative      PHYSICAL EXAM   BP 130/70 mmHg  Pulse 60  Ht 5\' 4"  (1.626 m)  Wt 138 lb 4 oz (62.71 kg)  BMI 23.72 kg/m2 Constitutional: He is oriented to person, place, and time. He appears well-developed and well-nourished. No distress.  HENT: No nasal discharge.  Head: Normocephalic and atraumatic.  Eyes: Pupils are equal and round.  No discharge. Neck: Normal range of motion. Neck supple. No JVD present. No thyromegaly present.  Cardiovascular: Normal rate, regular rhythm, normal heart sounds. Exam reveals no gallop and no friction rub. No murmur heard.  Pulmonary/Chest: Effort normal and breath sounds normal. No stridor. No respiratory distress. He has no wheezes. He has no rales. He exhibits no tenderness.  Abdominal: Soft. Bowel sounds are normal. He exhibits no distension. There is no tenderness. There is no rebound and no guarding.  Musculoskeletal: Normal range of motion. He  exhibits +1 edema and no tenderness.  Neurological: He is alert and oriented to person, place, and time. Coordination normal.  Skin: Skin is warm and dry. No rash noted. He is not diaphoretic. No erythema. No pallor.  Psychiatric: He has a normal mood and affect. His behavior is normal. Judgment and thought content normal.        ASSESSMENT AND PLAN

## 2014-10-23 NOTE — Telephone Encounter (Signed)
-----   Message from Sondra Bargesyan M Dunn, PA-C sent at 10/19/2014  1:27 PM EST ----- Please call the patient. -Heavily calcified aorto-iliac arteries, without stenosis, with ectatic (dilated) aorta -Largest portion is distal measuring 4.7 cm x 4.8 cm (prior records indicate 4.4 cm x 4.5 cm) -Moderate thrombus and plaque burden -Follow up abdominal US in 6 months (04/2015)

## 2014-10-23 NOTE — Patient Instructions (Signed)
Your physician has recommended you make the following change in your medication:  Decrease Amlodipine (Norvasc) to 5 mg once daily   Your physician recommends that you schedule a follow-up appointment in:  With your cardiologist in [place]

## 2014-10-23 NOTE — Telephone Encounter (Signed)
Discussed with patient in clinic today  Patient will follow up in [place]

## 2014-10-27 NOTE — Assessment & Plan Note (Signed)
We performed abdominal aortic ultrasound which showed the artery aneurysm size to be 4.7 x 4.8 cm which is only slightly larger than previously reported. I recommend referral to vascular surgery once the sizes 5 cm.

## 2014-10-27 NOTE — Assessment & Plan Note (Signed)
He is overall doing well with no symptoms suggestive of angina. He does have residual ostial LAD stenosis. The plan was to proceed with PCI. However, given his age, lack of anginal symptoms and chronic kidney disease, I elected to continue medical therapy for now. A nuclear stress test can be considered to see if he is significantly ischemic in the anterior wall. He is going back to his home in [place] and I strongly advised him to establish with a cardiologist there.

## 2014-10-27 NOTE — Assessment & Plan Note (Signed)
Blood pressure is well controlled but given his worsening lower extremity edema. I decreased the dose of amlodipine to 5 mg once daily.

## 2014-11-09 ENCOUNTER — Ambulatory Visit: Payer: Non-veteran care | Admitting: Physician Assistant

## 2014-11-16 DIAGNOSIS — J449 Chronic obstructive pulmonary disease, unspecified: Secondary | ICD-10-CM | POA: Diagnosis not present

## 2014-11-16 DIAGNOSIS — Z720 Tobacco use: Secondary | ICD-10-CM | POA: Diagnosis not present

## 2014-11-16 DIAGNOSIS — I259 Chronic ischemic heart disease, unspecified: Secondary | ICD-10-CM | POA: Diagnosis not present

## 2014-11-16 DIAGNOSIS — N189 Chronic kidney disease, unspecified: Secondary | ICD-10-CM | POA: Diagnosis not present

## 2014-11-16 DIAGNOSIS — L853 Xerosis cutis: Secondary | ICD-10-CM | POA: Diagnosis not present

## 2014-11-16 DIAGNOSIS — I129 Hypertensive chronic kidney disease with stage 1 through stage 4 chronic kidney disease, or unspecified chronic kidney disease: Secondary | ICD-10-CM | POA: Diagnosis not present

## 2014-11-16 DIAGNOSIS — F339 Major depressive disorder, recurrent, unspecified: Secondary | ICD-10-CM | POA: Diagnosis not present

## 2014-11-21 DIAGNOSIS — L853 Xerosis cutis: Secondary | ICD-10-CM | POA: Diagnosis not present

## 2014-11-21 DIAGNOSIS — I259 Chronic ischemic heart disease, unspecified: Secondary | ICD-10-CM | POA: Diagnosis not present

## 2014-11-21 DIAGNOSIS — J449 Chronic obstructive pulmonary disease, unspecified: Secondary | ICD-10-CM | POA: Diagnosis not present

## 2014-11-21 DIAGNOSIS — F339 Major depressive disorder, recurrent, unspecified: Secondary | ICD-10-CM | POA: Diagnosis not present

## 2014-11-21 DIAGNOSIS — N189 Chronic kidney disease, unspecified: Secondary | ICD-10-CM | POA: Diagnosis not present

## 2014-11-21 DIAGNOSIS — I129 Hypertensive chronic kidney disease with stage 1 through stage 4 chronic kidney disease, or unspecified chronic kidney disease: Secondary | ICD-10-CM | POA: Diagnosis not present

## 2014-11-24 DIAGNOSIS — L853 Xerosis cutis: Secondary | ICD-10-CM | POA: Diagnosis not present

## 2014-11-24 DIAGNOSIS — I129 Hypertensive chronic kidney disease with stage 1 through stage 4 chronic kidney disease, or unspecified chronic kidney disease: Secondary | ICD-10-CM | POA: Diagnosis not present

## 2014-11-24 DIAGNOSIS — N189 Chronic kidney disease, unspecified: Secondary | ICD-10-CM | POA: Diagnosis not present

## 2014-11-24 DIAGNOSIS — I259 Chronic ischemic heart disease, unspecified: Secondary | ICD-10-CM | POA: Diagnosis not present

## 2014-11-24 DIAGNOSIS — F339 Major depressive disorder, recurrent, unspecified: Secondary | ICD-10-CM | POA: Diagnosis not present

## 2014-11-24 DIAGNOSIS — J449 Chronic obstructive pulmonary disease, unspecified: Secondary | ICD-10-CM | POA: Diagnosis not present

## 2014-11-28 DIAGNOSIS — I129 Hypertensive chronic kidney disease with stage 1 through stage 4 chronic kidney disease, or unspecified chronic kidney disease: Secondary | ICD-10-CM | POA: Diagnosis not present

## 2014-11-28 DIAGNOSIS — L853 Xerosis cutis: Secondary | ICD-10-CM | POA: Diagnosis not present

## 2014-11-28 DIAGNOSIS — F339 Major depressive disorder, recurrent, unspecified: Secondary | ICD-10-CM | POA: Diagnosis not present

## 2014-11-28 DIAGNOSIS — N189 Chronic kidney disease, unspecified: Secondary | ICD-10-CM | POA: Diagnosis not present

## 2014-11-28 DIAGNOSIS — J449 Chronic obstructive pulmonary disease, unspecified: Secondary | ICD-10-CM | POA: Diagnosis not present

## 2014-11-28 DIAGNOSIS — I259 Chronic ischemic heart disease, unspecified: Secondary | ICD-10-CM | POA: Diagnosis not present

## 2014-11-30 DIAGNOSIS — N189 Chronic kidney disease, unspecified: Secondary | ICD-10-CM | POA: Diagnosis not present

## 2014-11-30 DIAGNOSIS — I259 Chronic ischemic heart disease, unspecified: Secondary | ICD-10-CM | POA: Diagnosis not present

## 2014-11-30 DIAGNOSIS — I129 Hypertensive chronic kidney disease with stage 1 through stage 4 chronic kidney disease, or unspecified chronic kidney disease: Secondary | ICD-10-CM | POA: Diagnosis not present

## 2014-11-30 DIAGNOSIS — L853 Xerosis cutis: Secondary | ICD-10-CM | POA: Diagnosis not present

## 2014-11-30 DIAGNOSIS — J449 Chronic obstructive pulmonary disease, unspecified: Secondary | ICD-10-CM | POA: Diagnosis not present

## 2014-11-30 DIAGNOSIS — F339 Major depressive disorder, recurrent, unspecified: Secondary | ICD-10-CM | POA: Diagnosis not present

## 2014-12-05 DIAGNOSIS — F339 Major depressive disorder, recurrent, unspecified: Secondary | ICD-10-CM | POA: Diagnosis not present

## 2014-12-05 DIAGNOSIS — N189 Chronic kidney disease, unspecified: Secondary | ICD-10-CM | POA: Diagnosis not present

## 2014-12-05 DIAGNOSIS — I259 Chronic ischemic heart disease, unspecified: Secondary | ICD-10-CM | POA: Diagnosis not present

## 2014-12-05 DIAGNOSIS — L853 Xerosis cutis: Secondary | ICD-10-CM | POA: Diagnosis not present

## 2014-12-05 DIAGNOSIS — J449 Chronic obstructive pulmonary disease, unspecified: Secondary | ICD-10-CM | POA: Diagnosis not present

## 2014-12-05 DIAGNOSIS — I129 Hypertensive chronic kidney disease with stage 1 through stage 4 chronic kidney disease, or unspecified chronic kidney disease: Secondary | ICD-10-CM | POA: Diagnosis not present

## 2014-12-13 DIAGNOSIS — F339 Major depressive disorder, recurrent, unspecified: Secondary | ICD-10-CM | POA: Diagnosis not present

## 2014-12-13 DIAGNOSIS — N189 Chronic kidney disease, unspecified: Secondary | ICD-10-CM | POA: Diagnosis not present

## 2014-12-13 DIAGNOSIS — I259 Chronic ischemic heart disease, unspecified: Secondary | ICD-10-CM | POA: Diagnosis not present

## 2014-12-13 DIAGNOSIS — L853 Xerosis cutis: Secondary | ICD-10-CM | POA: Diagnosis not present

## 2014-12-13 DIAGNOSIS — J449 Chronic obstructive pulmonary disease, unspecified: Secondary | ICD-10-CM | POA: Diagnosis not present

## 2014-12-13 DIAGNOSIS — I129 Hypertensive chronic kidney disease with stage 1 through stage 4 chronic kidney disease, or unspecified chronic kidney disease: Secondary | ICD-10-CM | POA: Diagnosis not present

## 2015-01-13 DIAGNOSIS — I509 Heart failure, unspecified: Secondary | ICD-10-CM | POA: Diagnosis not present

## 2015-01-13 DIAGNOSIS — F329 Major depressive disorder, single episode, unspecified: Secondary | ICD-10-CM | POA: Diagnosis not present

## 2015-01-13 DIAGNOSIS — I2510 Atherosclerotic heart disease of native coronary artery without angina pectoris: Secondary | ICD-10-CM | POA: Diagnosis not present

## 2015-01-13 DIAGNOSIS — I129 Hypertensive chronic kidney disease with stage 1 through stage 4 chronic kidney disease, or unspecified chronic kidney disease: Secondary | ICD-10-CM | POA: Diagnosis not present

## 2015-01-13 DIAGNOSIS — N183 Chronic kidney disease, stage 3 (moderate): Secondary | ICD-10-CM | POA: Diagnosis not present

## 2015-01-19 DIAGNOSIS — N183 Chronic kidney disease, stage 3 (moderate): Secondary | ICD-10-CM | POA: Diagnosis not present

## 2015-01-19 DIAGNOSIS — I2510 Atherosclerotic heart disease of native coronary artery without angina pectoris: Secondary | ICD-10-CM | POA: Diagnosis not present

## 2015-01-19 DIAGNOSIS — F329 Major depressive disorder, single episode, unspecified: Secondary | ICD-10-CM | POA: Diagnosis not present

## 2015-01-19 DIAGNOSIS — I509 Heart failure, unspecified: Secondary | ICD-10-CM | POA: Diagnosis not present

## 2015-01-19 DIAGNOSIS — I129 Hypertensive chronic kidney disease with stage 1 through stage 4 chronic kidney disease, or unspecified chronic kidney disease: Secondary | ICD-10-CM | POA: Diagnosis not present

## 2015-01-27 NOTE — Consult Note (Signed)
General Aspect Primary Cardiologist: [place] ________________  81 year old male with history of CAD s/p 2 vessel CABG in 1991 (LIMA-LAD & SVG-RCA), s/p stenting to LCx in 2013, AAA (5.7 cm), HTN, HLD, and BPH who is in town from [place], [place] visiting his daughter presented to [facility] this morning with an inferior STEMI (troponin 0.48).  ________________  PMH: 1. CAD s/p 2 vessel CABG in 1991 (LIMA-LAD & SVG-RCA) s/p stenting to LCx in 2013 2. AAA (5.7 cm) 3. HTN 4. HLD 5. BPH ________________   Present Illness 81 year old male with the above problem list presented to [facility] this morning with sudden onset of substernal chest pain radiating to his left arm and wrist and was found to have an inferior STEMI.   Patient is with known CAD s/p 2 vessel CABG in 1991 (LIMA-LAD & SVG-RCA) s/p stenting to LCx in 2013. Records are not on hand for review as his primary cardiologist is through the [facility]. He is also known to have a AAA which measures approximately 5.7 cm. He is in town from [place], [place] visiting his daughter. He was in his usual state of health this morning sitting down relaxing until he suddenly developed substernal chest pain that radiated to his left arm and wrist. No associated diaphoresis, nausea, vomting, SOB, presyncope, or syncope. He took 3 SL NTG without any help in symptoms. His daughter drove him to [facility] for further evaluation. He is a fairly active guy at baseline. No recent weight gain. No orthopnea. No early satiety.   Upon his arrival to [facility] he was noted to have an inferior ST elevation MI. EKG with with NSR, 65, inferior ST elevation with reciprocal changes, PVC and fusion complexes. Troponin 0.48. He was taken emergently to the cardiac cath lab where Dr. Fletcher Anon performed cardiac catheterization. Cardiac cath showed a patent LCx stent and occluded SVG-->RCA s/p PCI/DES x 2. Post cath he is stable and in the CCU without further angina.   Physical Exam:  GEN well  developed, well nourished, no acute distress   HEENT PERRL, hearing intact to voice   NECK supple   RESP normal resp effort   CARD Regular rate and rhythm  Murmur   ABD denies tenderness   SKIN normal to palpation   NEURO cranial nerves intact   PSYCH alert, A+O to time, place, person, good insight   Review of Systems:  General: No Complaints   Skin: No Complaints   ENT: No Complaints   Eyes: No Complaints   Neck: No Complaints   Respiratory: No Complaints   Cardiovascular: Chest pain or discomfort   Gastrointestinal: No Complaints   Genitourinary: No Complaints   Vascular: No Complaints   Musculoskeletal: No Complaints   Neurologic: No Complaints   Hematologic: No Complaints   Endocrine: No Complaints   Psychiatric: No Complaints   Review of Systems: All other systems were reviewed and found to be negative   Medications/Allergies Reviewed Medications/Allergies reviewed   Family & Social History:  Family and Social History:  Family History mother, father, and brother all with CAD and MI   Social History negative ETOH, negative Illicit drugs   + Tobacco Current (within 1 year)  65 pack years   Place of [place], [place]     AAA 7cm:    MI:    CABG:   Lab Results:  Routine Chem:  17-Dec-15 11:28   Result Comment TROPONIN - RESULTS VERIFIED BY  REPEAT TESTING.  Corey Skains AT 1259 09/21/14-DAS  - READ-BACK PROCESS PERFORMED.  Result(s) reported on 21 Sep 2014 at 12:39PM.  Glucose, Serum  128  BUN  31  Creatinine (comp)  1.78  Sodium, Serum 139  Potassium, Serum 4.1  Chloride, Serum 105  CO2, Serum 23  Calcium (Total), Serum 9.1  Anion Gap 11  Osmolality (calc) 286  eGFR (African American)  47  eGFR (Non-African American)  39 (eGFR values <30m/min/1.73 m2 may be an indication of chronic kidney disease (CKD). Calculated eGFR, using the MRDR Study equation, is useful in  patients with stable renal function. The  eGFR calculation will not be reliable in acutely ill patients when serum creatinine is changing rapidly. It is not useful in patients on dialysis. The eGFR calculation may not be applicable to patients at the low and high extremes of body sizes, pregnant women, and vegetarians.)  Cardiac:  17-Dec-15 11:28   Troponin I  0.48 (0.00-0.05 0.05 ng/mL or less: NEGATIVE  Repeat testing in 3-6 hrs  if clinically indicated. >0.05 ng/mL: POTENTIAL  MYOCARDIAL INJURY. Repeat  testing in 3-6 hrs if  clinically indicated. NOTE: An increase or decrease  of 30% or more on serial  testing suggests a  clinically important change)  CK, Total 47  CPK-MB, Serum 2.9 (Result(s) reported on 21 Sep 2014 at 01:29PM.)  Routine Hem:  17-Dec-15 11:28   WBC (CBC) 8.0  RBC (CBC)  4.28  Hemoglobin (CBC) 13.4  Hematocrit (CBC) 40.9  Platelet Count (CBC) 162  MCV 96  MCH 31.4  MCHC 32.9  RDW 13.8  Neutrophil % 73.7  Lymphocyte % 18.0  Monocyte % 6.0  Eosinophil % 1.6  Basophil % 0.7  Neutrophil # 5.9  Lymphocyte # 1.4  Monocyte # 0.5  Eosinophil # 0.1  Basophil # 0.1 (Result(s) reported on 21 Sep 2014 at 12:34PM.)   EKG:  EKG Interp. by me   Interpretation NSR, 65, inferior ST elevation with reciprocal changes, PVC and fusion complexes    Morphine: Other  Vital Signs/Nurse's Notes: **Vital Signs.:   17-Dec-15 13:10  Temperature Temperature (F) 97.8  Celsius 36.5  Temperature Source oral  Pulse Pulse 70  Respirations Respirations 12  Systolic BP Systolic BP 1267 Diastolic BP (mmHg) Diastolic BP (mmHg) 70  Mean BP 104  Pulse Ox % Pulse Ox % 100  Pulse Ox Activity Level  At rest  Oxygen Delivery 2L; Nasal Cannula    Impression 81year old male with history of CAD s/p 2 vessel CABG in 1991 (LIMA-LAD & SVG-RCA), s/p stenting to LCx in 2013, AAA (5.7 cm), HTN, HLD, and BPH [place] [facility]roponin  0.48).   1. Inferior STEMI: -Status post emergent cardiac cath with PCI/DES x 2 to occluded SVG-->RCA -Currently pain free -DAPT for at least 12 months (aspirin 81 mg and Brilinta 90 mg bid) -Defibrillator pads in place -Lipitor 40 mg  2. Accelerated HTN: -Lopressor 25 mg bid -Lisinopril on hold pending renal insufficiency -Labetalol prn SBP >170  3. Acute renal insufficiency: -SCr 1.78 upon admission -Recheck in AM -Gentle hydration -Avoid nephrotoxic agents  4. CAD s/p 2 vessel CABG s/p stenting to LCx in 2013: -Aspirin as above -Lipitor -Check A1C -B-blocker  5. HLD: -Check fasting lipid panel -Lipitor 40 mg   Electronic Signatures for Addendum Section:  AKathlyn Sacramento(MD) (Signed Addendum 17-Dec-15 17:35)  The  patient was seen and examined. Agree with the above. He presented with chest pain of 3 hours duration and was found to have inferior STEMI. Emergent cardiac cath showed: patent LCX stent, 90% ostial LAD stenosis, Occluded RCA with occluded SVG to RCA which was the culprit. I performed PCI and 2 DES placement to SVG to RCA.  Recommned:  Continue Aspirin and Brilinta for at least 1 year. Check Echo. Add Amlodipine for BP control.  LAD PCI in few weeks.   Electronic Signatures: Kathlyn Sacramento (MD)  (Signed 17-Dec-15 17:35)  Co-Signer: General Aspect/Present Illness, History and Physical Exam, Review of System, Family & Social History, Past Medical History, Home Medications, Labs, EKG , Allergies, Vital Signs/Nurse's Notes, Impression/Plan Dunn, Areta Haber (PA-C)  (Signed 17-Dec-15 15:56)  Authored: General Aspect/Present Illness, History and Physical Exam, Review of System, Family & Social History, Past Medical History, Home Medications, Labs, EKG , Allergies, Vital Signs/Nurse's Notes, Impression/Plan   Last Updated: 17-Dec-15 17:35 by Kathlyn Sacramento (MD)

## 2015-01-27 NOTE — Discharge Summary (Signed)
PATIENT NAME:  Daniel Huerta, Daniel Huerta MR#:  [id number] DATE OF BIRTH:  August 03, 1929  DATE OF ADMISSION:  09/21/2014 DATE OF DISCHARGE:  09/23/2014  DISCHARGE DIAGNOSES: 1. ST-elevation myocardial infarction with drug eluting stents to saphenous vein graft, right coronary artery.  2. A 90% occluded left anterior descending with percutaneous coronary intervention to be done in one week.  3. Hypertension, uncontrolled.  4. Chronic kidney disease stage III.  5. Tobacco abuse.   CONSULTATIONS:  Muhammad A. Kirke CorinArida, MD Antonieta Ibaimothy J. Gollan, MD, of cardiology   DISCHARGE MEDICATIONS: 1. Trazodone 50 mg daily.  2. Lisinopril 5 mg daily.  3. Levothyroxine 25 mcg daily.  4. Pravastatin 40 mg daily.  5. Terazosin 2 mg daily.  6. Nitroglycerin 0.4 mg sublingual as needed for chest pain.  7. Aspirin 81 mg daily.  8. Brilinta 90 mg 2 times a day.  9. Coreg 3.125 mg 2 times a day.  10. Amlodipine 5 mg daily.   DISCHARGE INSTRUCTIONS:  1. Low-sodium, low-fat diet.  2. Activity: As tolerated without any heavy lifting or exertional activity.  3. The patient needs followup with Dr. Kirke CorinArida for a repeat cardiac catheterization and PCI of his LAD in a week.  4. The patient has been counseled to quit smoking.   ADMITTING HISTORY AND PHYSICAL: Please see detailed H and P dictated previously. In brief, an 81 year old Caucasian male patient with history of CAD status post CABG who presented to the hospital complaining of 2 hours of chest pain here. Was found to have inferior  STEMI, was taken to the cardiac catheterization emergently, had drug-eluting stents placed in SVG, RCA and was admitted to CCU. The patient did well post catheterization, other than having elevated blood pressure, for which Norvasc was added and blood pressure is improving. He was started on aspirin, Brilinta, beta blocker, ACE inhibitor, statins.   By the day of discharge, the patient was doing well, ambulated without any problems. Presently plan is  for the patient to be optimized medically and repeat cardiac catheterization and PCI of the LAD, which still has 90% occlusion.   Prior to discharge, the patient's lungs sound clear, heart sounds are S1, S2, without any murmurs, no edema. I have discussed the case with Dr. Mariah MillingGollan and the patient will be discharged back home in a stable condition. The patient needs to be referred to cardiac rehab after his repeat cardiac catheterization.   TIME SPENT ON DAY OF DISCHARGE IN DISCHARGE ACTIVITY: 35 minutes.    ____________________________ Molinda BailiffSrikar R. Aviya Jarvie, MD srs:lm D: 09/24/2014 15:10:40 ET T: 09/24/2014 21:10:46 ET JOB#: [id number]  cc: Wardell HeathSrikar R. Kessie Croston, MD, <Dictator> Muhammad A. Kirke CorinArida, MD Orie FishermanSRIKAR R Jamilia Jacques MD ELECTRONICALLY SIGNED 09/25/2014 18:25

## 2015-01-27 NOTE — Consult Note (Signed)
PATIENT NAME:  Daniel Huerta, Kadar L MR#:  [id number] DATE OF BIRTH:  [birth year]  DATE OF CONSULTATION:  09/21/2014  REFERRING PHYSICIAN:  Muhammad A. Kirke CorinArida, MD CONSULTING PHYSICIAN:  Bowdy Bair R. Starasia Sinko, MD  PRIMARY CARE PHYSICIAN[place]   CHIEF COMPLAINT: Chest pain.   REASON FOR CONSULTATION: CAD, hypertension, CKD stage III.   HISTORY OF PRESENTING ILLNESS: An 81 year old Caucasian male patient with history of CAD status post CABG in 1991 and repeat cath with PCI in 2013, presents to the Emergency Room complaining of acute onset of chest pain radiating to the left arm. The patient was found to have A STEMI in the inferior leads. He was taken to the cardiac cath lab. He has received 2 drug-eluding stents, occluded SVG to RCA. Presently, he is chest pain-free, without any concerns. No shortness of breath, nausea, vomiting. Admitted to CCU.   Blood pressure is mildly elevated at 172/70.   PAST MEDICAL HISTORY: 1. Hypertension.  2. Hyperlipidemia.  3. CAD status post CABG in 1991 in [place] and cardiac [place]  4. Carotid artery stenosis with endarterectomy of the left side.  5. Possible CKD stage III.  6. Abdominal aortic aneurysm.  7. Hypothyroidism  HOME MEDICATIONS: Are not available at this time, but the patient mentions he is on aspirin. No other blood thinners.   ALLERGIES: No drug allergies.   FAMILY HISTORY: CAD in both his father and mother.   REVIEW OF SYSTEMS: Please see history of presenting illness.  CONSTITUTIONAL: No fever, fatigue, weakness.  EYES: No blurred vision, pain or redness.   ENT: No tinnitus, ear pain, hearing loss. RESPIRATORY: No cough, wheeze, hemoptysis.  CARDIOVASCULAR: Had chest pain, which has resolved now.  GASTROINTESTINAL: No nausea,  vomiting, diarrhea, abdominal pain.  GENITOURINARY: No dysuria, hematuria, or frequency.  ENDOCRINE: No polyuria, nocturia, thyroid problems.   HEMATOLOGIC AND LYMPHATIC: No anemia, easy bruising, bleeding.  INTEGUMENTARY:   No acne, rash, lesions.  MUSCULOSKELETAL: No back pain, arthritis. NEUROLOGICAL: No focal numbness, weakness, seizure.  PSYCHIATRIC: No anxiety or depression.   SOCIAL HISTORY: The patient continues to smoke. No alcohol. No illicit drug use. Ambulates with a walker.   CODE STATUS: DNR/DNI.   PHYSICAL EXAMINATION: VITAL SIGNS: Temperature 37.8, pulse 70, blood pressure 172/70, respirations 15, saturating 100% on 2 liters oxygen.  GENERAL: Moderately built Caucasian male patient lying in bed, seems comfortable. Conversational. Cooperative with exam.  PSYCHIATRIC: Alert and oriented x 3. Mood and affect appropriate. Judgment intact.  HEENT: Atraumatic, normocephalic. Oral mucosa moist and pink. External ears and nose normal. No pallor.  No icterus. Pupils bilaterally equal and reactive to light.  NECK: Supple. No thyromegaly. No palpable lymph nodes. Trachea midline. No carotid bruit, JVD.  CARDIOVASCULAR: S1, S2, without any murmurs. Peripheral pulses 2+. No edema.  RESPIRATORY: Normal work of breathing. Clear to auscultation on both sides.  GASTROINTESTINAL: Soft abdomen, nontender. Bowel sounds present. No organomegaly palpable.  SKIN: Warm and dry. No petechiae, rash, ulcers.  MUSCULOSKELETAL: No joint swelling, redness, effusion of the large joints. Normal muscle tone.  NEUROLOGICAL: Motor strength 5/5 in upper and lower extremities. Sensation is intact all over.  LYMPHATIC: No cervical or supraclavicular lymphadenopathy.   LABORATORY STUDIES: Glucose 128, BUN 31, creatinine 1.78, sodium 139, potassium 4.1, GFR 39, troponin 0.48. WBC 8, hemoglobin 13.4, platelets 162.   Baseline EKG shows ST elevation in the inferior leads along with frequent PVCs.   ASSESSMENT AND PLAN:  1. Inferior ST-elevation myocardial infarction with percutaneous coronary intervention with 2 drug-eluting stents to saphenous vein  graft right coronary artery. The patient is chest pain-free at this point. He is on aspirin and Brilinta, along with statin and beta blocker. We will hold the angiotensin converting enzyme inhibitor, as he just got a contrast, can start from tomorrow. The patient will also be on normal saline at 75 mL per hour.  We will stop after a liter, considering his chronic kidney disease/III.  The patient is being followed by cardiology, Dr. Kirke Corin.  2. Hypertension. Presently, blood pressure rate is in the 170s. We will start him on metoprolol, use p.r.n. labetalol. His home medication list is abated. At this point, we will resume his medications when available.  3. Chronic kidney disease, stage III. The patient does not know of any kidney problems. It is unclear if this is acute renal failure, but considering his age, I suspect  he does have mild chronic kidney disease III. We will put him on IV fluids. Repeat BMP tomorrow morning.  4. Tobacco abuse. Counseled the patient for better than 3 minutes to quit smoking. He has verbalized understanding and wants to quit.  5. Deep vein thrombosis prophylaxis. Sequential compression devices.   CODE STATUS: DNR/DNI as discussed with the patient.   Time spent on this case was 40 minutes.   ____________________________ Molinda Bailiff Almin Livingstone, MD srs:mw D: 09/21/2014 13:28:51 ET T: 09/21/2014 14:56:21 ET JOB#: [id number]  cc: Wardell Heath R. Karne Ozga, MD, <Dictator> Muhammad A. Kirke Corin, MD  Orie Fisherman MD ELECTRONICALLY SIGNED 09/22/2014 12:38

## 2015-01-29 DIAGNOSIS — I739 Peripheral vascular disease, unspecified: Secondary | ICD-10-CM | POA: Diagnosis not present

## 2015-01-29 DIAGNOSIS — I129 Hypertensive chronic kidney disease with stage 1 through stage 4 chronic kidney disease, or unspecified chronic kidney disease: Secondary | ICD-10-CM | POA: Diagnosis not present

## 2015-01-29 DIAGNOSIS — I872 Venous insufficiency (chronic) (peripheral): Secondary | ICD-10-CM | POA: Diagnosis not present

## 2015-01-29 DIAGNOSIS — J449 Chronic obstructive pulmonary disease, unspecified: Secondary | ICD-10-CM | POA: Diagnosis not present

## 2015-01-29 DIAGNOSIS — Z87891 Personal history of nicotine dependence: Secondary | ICD-10-CM | POA: Diagnosis not present

## 2015-01-29 DIAGNOSIS — I5022 Chronic systolic (congestive) heart failure: Secondary | ICD-10-CM | POA: Diagnosis not present

## 2015-01-29 DIAGNOSIS — Z959 Presence of cardiac and vascular implant and graft, unspecified: Secondary | ICD-10-CM | POA: Diagnosis not present

## 2015-01-29 DIAGNOSIS — N189 Chronic kidney disease, unspecified: Secondary | ICD-10-CM | POA: Diagnosis not present

## 2015-01-29 DIAGNOSIS — F339 Major depressive disorder, recurrent, unspecified: Secondary | ICD-10-CM | POA: Diagnosis not present

## 2015-01-29 DIAGNOSIS — I259 Chronic ischemic heart disease, unspecified: Secondary | ICD-10-CM | POA: Diagnosis not present

## 2015-02-01 DIAGNOSIS — I739 Peripheral vascular disease, unspecified: Secondary | ICD-10-CM | POA: Diagnosis not present

## 2015-02-01 DIAGNOSIS — N189 Chronic kidney disease, unspecified: Secondary | ICD-10-CM | POA: Diagnosis not present

## 2015-02-01 DIAGNOSIS — I872 Venous insufficiency (chronic) (peripheral): Secondary | ICD-10-CM | POA: Diagnosis not present

## 2015-02-01 DIAGNOSIS — I259 Chronic ischemic heart disease, unspecified: Secondary | ICD-10-CM | POA: Diagnosis not present

## 2015-02-01 DIAGNOSIS — I5022 Chronic systolic (congestive) heart failure: Secondary | ICD-10-CM | POA: Diagnosis not present

## 2015-02-01 DIAGNOSIS — I129 Hypertensive chronic kidney disease with stage 1 through stage 4 chronic kidney disease, or unspecified chronic kidney disease: Secondary | ICD-10-CM | POA: Diagnosis not present

## 2015-02-05 DIAGNOSIS — I5022 Chronic systolic (congestive) heart failure: Secondary | ICD-10-CM | POA: Diagnosis not present

## 2015-02-05 DIAGNOSIS — I129 Hypertensive chronic kidney disease with stage 1 through stage 4 chronic kidney disease, or unspecified chronic kidney disease: Secondary | ICD-10-CM | POA: Diagnosis not present

## 2015-02-05 DIAGNOSIS — I259 Chronic ischemic heart disease, unspecified: Secondary | ICD-10-CM | POA: Diagnosis not present

## 2015-02-05 DIAGNOSIS — I872 Venous insufficiency (chronic) (peripheral): Secondary | ICD-10-CM | POA: Diagnosis not present

## 2015-02-05 DIAGNOSIS — I739 Peripheral vascular disease, unspecified: Secondary | ICD-10-CM | POA: Diagnosis not present

## 2015-02-05 DIAGNOSIS — N189 Chronic kidney disease, unspecified: Secondary | ICD-10-CM | POA: Diagnosis not present

## 2015-02-07 DIAGNOSIS — I872 Venous insufficiency (chronic) (peripheral): Secondary | ICD-10-CM | POA: Diagnosis not present

## 2015-02-07 DIAGNOSIS — N189 Chronic kidney disease, unspecified: Secondary | ICD-10-CM | POA: Diagnosis not present

## 2015-02-07 DIAGNOSIS — I5022 Chronic systolic (congestive) heart failure: Secondary | ICD-10-CM | POA: Diagnosis not present

## 2015-02-07 DIAGNOSIS — I129 Hypertensive chronic kidney disease with stage 1 through stage 4 chronic kidney disease, or unspecified chronic kidney disease: Secondary | ICD-10-CM | POA: Diagnosis not present

## 2015-02-07 DIAGNOSIS — I259 Chronic ischemic heart disease, unspecified: Secondary | ICD-10-CM | POA: Diagnosis not present

## 2015-02-07 DIAGNOSIS — I739 Peripheral vascular disease, unspecified: Secondary | ICD-10-CM | POA: Diagnosis not present

## 2015-02-12 DIAGNOSIS — I739 Peripheral vascular disease, unspecified: Secondary | ICD-10-CM | POA: Diagnosis not present

## 2015-02-12 DIAGNOSIS — I5022 Chronic systolic (congestive) heart failure: Secondary | ICD-10-CM | POA: Diagnosis not present

## 2015-02-12 DIAGNOSIS — I129 Hypertensive chronic kidney disease with stage 1 through stage 4 chronic kidney disease, or unspecified chronic kidney disease: Secondary | ICD-10-CM | POA: Diagnosis not present

## 2015-02-12 DIAGNOSIS — I259 Chronic ischemic heart disease, unspecified: Secondary | ICD-10-CM | POA: Diagnosis not present

## 2015-02-12 DIAGNOSIS — N189 Chronic kidney disease, unspecified: Secondary | ICD-10-CM | POA: Diagnosis not present

## 2015-02-12 DIAGNOSIS — I872 Venous insufficiency (chronic) (peripheral): Secondary | ICD-10-CM | POA: Diagnosis not present

## 2015-03-07 DIAGNOSIS — I5022 Chronic systolic (congestive) heart failure: Secondary | ICD-10-CM | POA: Diagnosis not present

## 2015-03-07 DIAGNOSIS — I872 Venous insufficiency (chronic) (peripheral): Secondary | ICD-10-CM | POA: Diagnosis not present

## 2015-03-07 DIAGNOSIS — N189 Chronic kidney disease, unspecified: Secondary | ICD-10-CM | POA: Diagnosis not present

## 2015-03-07 DIAGNOSIS — I129 Hypertensive chronic kidney disease with stage 1 through stage 4 chronic kidney disease, or unspecified chronic kidney disease: Secondary | ICD-10-CM | POA: Diagnosis not present

## 2015-03-07 DIAGNOSIS — I259 Chronic ischemic heart disease, unspecified: Secondary | ICD-10-CM | POA: Diagnosis not present

## 2015-03-07 DIAGNOSIS — I739 Peripheral vascular disease, unspecified: Secondary | ICD-10-CM | POA: Diagnosis not present

## 2015-03-08 DIAGNOSIS — N189 Chronic kidney disease, unspecified: Secondary | ICD-10-CM | POA: Diagnosis not present

## 2015-03-08 DIAGNOSIS — I129 Hypertensive chronic kidney disease with stage 1 through stage 4 chronic kidney disease, or unspecified chronic kidney disease: Secondary | ICD-10-CM | POA: Diagnosis not present

## 2015-03-08 DIAGNOSIS — I5022 Chronic systolic (congestive) heart failure: Secondary | ICD-10-CM | POA: Diagnosis not present

## 2015-03-08 DIAGNOSIS — I259 Chronic ischemic heart disease, unspecified: Secondary | ICD-10-CM | POA: Diagnosis not present

## 2015-03-08 DIAGNOSIS — I872 Venous insufficiency (chronic) (peripheral): Secondary | ICD-10-CM | POA: Diagnosis not present

## 2015-03-08 DIAGNOSIS — I739 Peripheral vascular disease, unspecified: Secondary | ICD-10-CM | POA: Diagnosis not present

## 2015-03-09 DIAGNOSIS — I5022 Chronic systolic (congestive) heart failure: Secondary | ICD-10-CM | POA: Diagnosis not present

## 2015-03-09 DIAGNOSIS — N189 Chronic kidney disease, unspecified: Secondary | ICD-10-CM | POA: Diagnosis not present

## 2015-03-09 DIAGNOSIS — I872 Venous insufficiency (chronic) (peripheral): Secondary | ICD-10-CM | POA: Diagnosis not present

## 2015-03-09 DIAGNOSIS — I129 Hypertensive chronic kidney disease with stage 1 through stage 4 chronic kidney disease, or unspecified chronic kidney disease: Secondary | ICD-10-CM | POA: Diagnosis not present

## 2015-03-09 DIAGNOSIS — I739 Peripheral vascular disease, unspecified: Secondary | ICD-10-CM | POA: Diagnosis not present

## 2015-03-09 DIAGNOSIS — I259 Chronic ischemic heart disease, unspecified: Secondary | ICD-10-CM | POA: Diagnosis not present

## 2015-03-12 DIAGNOSIS — I872 Venous insufficiency (chronic) (peripheral): Secondary | ICD-10-CM | POA: Diagnosis not present

## 2015-03-12 DIAGNOSIS — I129 Hypertensive chronic kidney disease with stage 1 through stage 4 chronic kidney disease, or unspecified chronic kidney disease: Secondary | ICD-10-CM | POA: Diagnosis not present

## 2015-03-12 DIAGNOSIS — I739 Peripheral vascular disease, unspecified: Secondary | ICD-10-CM | POA: Diagnosis not present

## 2015-03-12 DIAGNOSIS — I5022 Chronic systolic (congestive) heart failure: Secondary | ICD-10-CM | POA: Diagnosis not present

## 2015-03-12 DIAGNOSIS — N189 Chronic kidney disease, unspecified: Secondary | ICD-10-CM | POA: Diagnosis not present

## 2015-03-12 DIAGNOSIS — I259 Chronic ischemic heart disease, unspecified: Secondary | ICD-10-CM | POA: Diagnosis not present

## 2015-04-03 DIAGNOSIS — N183 Chronic kidney disease, stage 3 (moderate): Secondary | ICD-10-CM | POA: Diagnosis not present

## 2015-04-03 DIAGNOSIS — I2510 Atherosclerotic heart disease of native coronary artery without angina pectoris: Secondary | ICD-10-CM | POA: Diagnosis not present

## 2015-04-03 DIAGNOSIS — M6281 Muscle weakness (generalized): Secondary | ICD-10-CM | POA: Diagnosis not present

## 2015-04-03 DIAGNOSIS — I509 Heart failure, unspecified: Secondary | ICD-10-CM | POA: Diagnosis not present

## 2015-04-03 DIAGNOSIS — I129 Hypertensive chronic kidney disease with stage 1 through stage 4 chronic kidney disease, or unspecified chronic kidney disease: Secondary | ICD-10-CM | POA: Diagnosis not present

## 2015-04-03 DIAGNOSIS — R471 Dysarthria and anarthria: Secondary | ICD-10-CM | POA: Diagnosis not present

## 2015-04-05 DIAGNOSIS — M6281 Muscle weakness (generalized): Secondary | ICD-10-CM | POA: Diagnosis not present

## 2015-04-05 DIAGNOSIS — I2510 Atherosclerotic heart disease of native coronary artery without angina pectoris: Secondary | ICD-10-CM | POA: Diagnosis not present

## 2015-04-05 DIAGNOSIS — N183 Chronic kidney disease, stage 3 (moderate): Secondary | ICD-10-CM | POA: Diagnosis not present

## 2015-04-05 DIAGNOSIS — I129 Hypertensive chronic kidney disease with stage 1 through stage 4 chronic kidney disease, or unspecified chronic kidney disease: Secondary | ICD-10-CM | POA: Diagnosis not present

## 2015-04-05 DIAGNOSIS — I509 Heart failure, unspecified: Secondary | ICD-10-CM | POA: Diagnosis not present

## 2015-04-05 DIAGNOSIS — R471 Dysarthria and anarthria: Secondary | ICD-10-CM | POA: Diagnosis not present

## 2015-04-13 DIAGNOSIS — I739 Peripheral vascular disease, unspecified: Secondary | ICD-10-CM | POA: Diagnosis not present

## 2015-04-13 DIAGNOSIS — Z959 Presence of cardiac and vascular implant and graft, unspecified: Secondary | ICD-10-CM | POA: Diagnosis not present

## 2015-04-13 DIAGNOSIS — I2510 Atherosclerotic heart disease of native coronary artery without angina pectoris: Secondary | ICD-10-CM | POA: Diagnosis not present

## 2015-04-13 DIAGNOSIS — Z87891 Personal history of nicotine dependence: Secondary | ICD-10-CM | POA: Diagnosis not present

## 2015-04-13 DIAGNOSIS — I129 Hypertensive chronic kidney disease with stage 1 through stage 4 chronic kidney disease, or unspecified chronic kidney disease: Secondary | ICD-10-CM | POA: Diagnosis not present

## 2015-04-13 DIAGNOSIS — F339 Major depressive disorder, recurrent, unspecified: Secondary | ICD-10-CM | POA: Diagnosis not present

## 2015-04-13 DIAGNOSIS — I5022 Chronic systolic (congestive) heart failure: Secondary | ICD-10-CM | POA: Diagnosis not present

## 2015-04-13 DIAGNOSIS — Z720 Tobacco use: Secondary | ICD-10-CM | POA: Diagnosis not present

## 2015-04-13 DIAGNOSIS — N189 Chronic kidney disease, unspecified: Secondary | ICD-10-CM | POA: Diagnosis not present

## 2015-04-13 DIAGNOSIS — I2582 Chronic total occlusion of coronary artery: Secondary | ICD-10-CM | POA: Diagnosis not present

## 2015-04-13 DIAGNOSIS — J449 Chronic obstructive pulmonary disease, unspecified: Secondary | ICD-10-CM | POA: Diagnosis not present

## 2015-04-13 DIAGNOSIS — I872 Venous insufficiency (chronic) (peripheral): Secondary | ICD-10-CM | POA: Diagnosis not present

## 2015-04-24 DIAGNOSIS — J449 Chronic obstructive pulmonary disease, unspecified: Secondary | ICD-10-CM | POA: Diagnosis not present

## 2015-04-24 DIAGNOSIS — I2582 Chronic total occlusion of coronary artery: Secondary | ICD-10-CM | POA: Diagnosis not present

## 2015-04-24 DIAGNOSIS — N189 Chronic kidney disease, unspecified: Secondary | ICD-10-CM | POA: Diagnosis not present

## 2015-04-24 DIAGNOSIS — I5022 Chronic systolic (congestive) heart failure: Secondary | ICD-10-CM | POA: Diagnosis not present

## 2015-04-24 DIAGNOSIS — I129 Hypertensive chronic kidney disease with stage 1 through stage 4 chronic kidney disease, or unspecified chronic kidney disease: Secondary | ICD-10-CM | POA: Diagnosis not present

## 2015-04-24 DIAGNOSIS — I2510 Atherosclerotic heart disease of native coronary artery without angina pectoris: Secondary | ICD-10-CM | POA: Diagnosis not present

## 2015-04-25 DIAGNOSIS — J449 Chronic obstructive pulmonary disease, unspecified: Secondary | ICD-10-CM | POA: Diagnosis not present

## 2015-04-25 DIAGNOSIS — I2510 Atherosclerotic heart disease of native coronary artery without angina pectoris: Secondary | ICD-10-CM | POA: Diagnosis not present

## 2015-04-25 DIAGNOSIS — N189 Chronic kidney disease, unspecified: Secondary | ICD-10-CM | POA: Diagnosis not present

## 2015-04-25 DIAGNOSIS — I129 Hypertensive chronic kidney disease with stage 1 through stage 4 chronic kidney disease, or unspecified chronic kidney disease: Secondary | ICD-10-CM | POA: Diagnosis not present

## 2015-04-25 DIAGNOSIS — I2582 Chronic total occlusion of coronary artery: Secondary | ICD-10-CM | POA: Diagnosis not present

## 2015-04-25 DIAGNOSIS — I5022 Chronic systolic (congestive) heart failure: Secondary | ICD-10-CM | POA: Diagnosis not present

## 2015-04-27 DIAGNOSIS — I5022 Chronic systolic (congestive) heart failure: Secondary | ICD-10-CM | POA: Diagnosis not present

## 2015-04-27 DIAGNOSIS — J449 Chronic obstructive pulmonary disease, unspecified: Secondary | ICD-10-CM | POA: Diagnosis not present

## 2015-04-27 DIAGNOSIS — N189 Chronic kidney disease, unspecified: Secondary | ICD-10-CM | POA: Diagnosis not present

## 2015-04-27 DIAGNOSIS — I129 Hypertensive chronic kidney disease with stage 1 through stage 4 chronic kidney disease, or unspecified chronic kidney disease: Secondary | ICD-10-CM | POA: Diagnosis not present

## 2015-04-27 DIAGNOSIS — I2582 Chronic total occlusion of coronary artery: Secondary | ICD-10-CM | POA: Diagnosis not present

## 2015-04-27 DIAGNOSIS — I2510 Atherosclerotic heart disease of native coronary artery without angina pectoris: Secondary | ICD-10-CM | POA: Diagnosis not present

## 2015-05-01 DIAGNOSIS — I2582 Chronic total occlusion of coronary artery: Secondary | ICD-10-CM | POA: Diagnosis not present

## 2015-05-01 DIAGNOSIS — I2510 Atherosclerotic heart disease of native coronary artery without angina pectoris: Secondary | ICD-10-CM | POA: Diagnosis not present

## 2015-05-01 DIAGNOSIS — I129 Hypertensive chronic kidney disease with stage 1 through stage 4 chronic kidney disease, or unspecified chronic kidney disease: Secondary | ICD-10-CM | POA: Diagnosis not present

## 2015-05-01 DIAGNOSIS — I5022 Chronic systolic (congestive) heart failure: Secondary | ICD-10-CM | POA: Diagnosis not present

## 2015-05-01 DIAGNOSIS — J449 Chronic obstructive pulmonary disease, unspecified: Secondary | ICD-10-CM | POA: Diagnosis not present

## 2015-05-01 DIAGNOSIS — N189 Chronic kidney disease, unspecified: Secondary | ICD-10-CM | POA: Diagnosis not present

## 2015-05-03 DIAGNOSIS — I2582 Chronic total occlusion of coronary artery: Secondary | ICD-10-CM | POA: Diagnosis not present

## 2015-05-03 DIAGNOSIS — J449 Chronic obstructive pulmonary disease, unspecified: Secondary | ICD-10-CM | POA: Diagnosis not present

## 2015-05-03 DIAGNOSIS — I5022 Chronic systolic (congestive) heart failure: Secondary | ICD-10-CM | POA: Diagnosis not present

## 2015-05-03 DIAGNOSIS — N189 Chronic kidney disease, unspecified: Secondary | ICD-10-CM | POA: Diagnosis not present

## 2015-05-03 DIAGNOSIS — I2510 Atherosclerotic heart disease of native coronary artery without angina pectoris: Secondary | ICD-10-CM | POA: Diagnosis not present

## 2015-05-03 DIAGNOSIS — I129 Hypertensive chronic kidney disease with stage 1 through stage 4 chronic kidney disease, or unspecified chronic kidney disease: Secondary | ICD-10-CM | POA: Diagnosis not present

## 2015-05-07 DIAGNOSIS — I129 Hypertensive chronic kidney disease with stage 1 through stage 4 chronic kidney disease, or unspecified chronic kidney disease: Secondary | ICD-10-CM | POA: Diagnosis not present

## 2015-05-07 DIAGNOSIS — N189 Chronic kidney disease, unspecified: Secondary | ICD-10-CM | POA: Diagnosis not present

## 2015-05-07 DIAGNOSIS — J449 Chronic obstructive pulmonary disease, unspecified: Secondary | ICD-10-CM | POA: Diagnosis not present

## 2015-05-07 DIAGNOSIS — I2510 Atherosclerotic heart disease of native coronary artery without angina pectoris: Secondary | ICD-10-CM | POA: Diagnosis not present

## 2015-05-07 DIAGNOSIS — I2582 Chronic total occlusion of coronary artery: Secondary | ICD-10-CM | POA: Diagnosis not present

## 2015-05-07 DIAGNOSIS — I5022 Chronic systolic (congestive) heart failure: Secondary | ICD-10-CM | POA: Diagnosis not present

## 2015-05-08 DIAGNOSIS — J449 Chronic obstructive pulmonary disease, unspecified: Secondary | ICD-10-CM | POA: Diagnosis not present

## 2015-05-08 DIAGNOSIS — N189 Chronic kidney disease, unspecified: Secondary | ICD-10-CM | POA: Diagnosis not present

## 2015-05-08 DIAGNOSIS — I2510 Atherosclerotic heart disease of native coronary artery without angina pectoris: Secondary | ICD-10-CM | POA: Diagnosis not present

## 2015-05-08 DIAGNOSIS — I5022 Chronic systolic (congestive) heart failure: Secondary | ICD-10-CM | POA: Diagnosis not present

## 2015-05-08 DIAGNOSIS — I129 Hypertensive chronic kidney disease with stage 1 through stage 4 chronic kidney disease, or unspecified chronic kidney disease: Secondary | ICD-10-CM | POA: Diagnosis not present

## 2015-05-08 DIAGNOSIS — I2582 Chronic total occlusion of coronary artery: Secondary | ICD-10-CM | POA: Diagnosis not present

## 2015-05-09 DIAGNOSIS — M810 Age-related osteoporosis without current pathological fracture: Secondary | ICD-10-CM | POA: Diagnosis not present

## 2015-05-09 DIAGNOSIS — I10 Essential (primary) hypertension: Secondary | ICD-10-CM | POA: Diagnosis not present

## 2015-05-09 DIAGNOSIS — F17210 Nicotine dependence, cigarettes, uncomplicated: Secondary | ICD-10-CM | POA: Diagnosis not present

## 2015-05-09 DIAGNOSIS — Z79899 Other long term (current) drug therapy: Secondary | ICD-10-CM | POA: Diagnosis not present

## 2015-05-09 DIAGNOSIS — N189 Chronic kidney disease, unspecified: Secondary | ICD-10-CM | POA: Diagnosis not present

## 2015-05-09 DIAGNOSIS — I129 Hypertensive chronic kidney disease with stage 1 through stage 4 chronic kidney disease, or unspecified chronic kidney disease: Secondary | ICD-10-CM | POA: Diagnosis not present

## 2015-05-09 DIAGNOSIS — E785 Hyperlipidemia, unspecified: Secondary | ICD-10-CM | POA: Diagnosis not present

## 2015-05-09 DIAGNOSIS — J449 Chronic obstructive pulmonary disease, unspecified: Secondary | ICD-10-CM | POA: Diagnosis not present

## 2015-05-09 DIAGNOSIS — I2582 Chronic total occlusion of coronary artery: Secondary | ICD-10-CM | POA: Diagnosis not present

## 2015-05-09 DIAGNOSIS — I2510 Atherosclerotic heart disease of native coronary artery without angina pectoris: Secondary | ICD-10-CM | POA: Diagnosis not present

## 2015-05-09 DIAGNOSIS — I5022 Chronic systolic (congestive) heart failure: Secondary | ICD-10-CM | POA: Diagnosis not present

## 2015-05-09 DIAGNOSIS — I509 Heart failure, unspecified: Secondary | ICD-10-CM | POA: Diagnosis not present

## 2015-05-11 DIAGNOSIS — I5022 Chronic systolic (congestive) heart failure: Secondary | ICD-10-CM | POA: Diagnosis not present

## 2015-05-11 DIAGNOSIS — J449 Chronic obstructive pulmonary disease, unspecified: Secondary | ICD-10-CM | POA: Diagnosis not present

## 2015-05-11 DIAGNOSIS — I2510 Atherosclerotic heart disease of native coronary artery without angina pectoris: Secondary | ICD-10-CM | POA: Diagnosis not present

## 2015-05-11 DIAGNOSIS — N189 Chronic kidney disease, unspecified: Secondary | ICD-10-CM | POA: Diagnosis not present

## 2015-05-11 DIAGNOSIS — I2582 Chronic total occlusion of coronary artery: Secondary | ICD-10-CM | POA: Diagnosis not present

## 2015-05-11 DIAGNOSIS — I129 Hypertensive chronic kidney disease with stage 1 through stage 4 chronic kidney disease, or unspecified chronic kidney disease: Secondary | ICD-10-CM | POA: Diagnosis not present

## 2015-05-14 DIAGNOSIS — I2582 Chronic total occlusion of coronary artery: Secondary | ICD-10-CM | POA: Diagnosis not present

## 2015-05-14 DIAGNOSIS — N189 Chronic kidney disease, unspecified: Secondary | ICD-10-CM | POA: Diagnosis not present

## 2015-05-14 DIAGNOSIS — I129 Hypertensive chronic kidney disease with stage 1 through stage 4 chronic kidney disease, or unspecified chronic kidney disease: Secondary | ICD-10-CM | POA: Diagnosis not present

## 2015-05-14 DIAGNOSIS — I2510 Atherosclerotic heart disease of native coronary artery without angina pectoris: Secondary | ICD-10-CM | POA: Diagnosis not present

## 2015-05-14 DIAGNOSIS — I5022 Chronic systolic (congestive) heart failure: Secondary | ICD-10-CM | POA: Diagnosis not present

## 2015-05-14 DIAGNOSIS — J449 Chronic obstructive pulmonary disease, unspecified: Secondary | ICD-10-CM | POA: Diagnosis not present

## 2015-05-15 ENCOUNTER — Telehealth: Payer: Self-pay | Admitting: *Deleted

## 2015-05-15 NOTE — Telephone Encounter (Signed)
Lmom to call our office. Time to scheduel Abd Aorta u/s (6 mth f/u).

## 2015-05-22 DIAGNOSIS — I129 Hypertensive chronic kidney disease with stage 1 through stage 4 chronic kidney disease, or unspecified chronic kidney disease: Secondary | ICD-10-CM | POA: Diagnosis not present

## 2015-05-22 DIAGNOSIS — I2582 Chronic total occlusion of coronary artery: Secondary | ICD-10-CM | POA: Diagnosis not present

## 2015-05-22 DIAGNOSIS — N189 Chronic kidney disease, unspecified: Secondary | ICD-10-CM | POA: Diagnosis not present

## 2015-05-22 DIAGNOSIS — J449 Chronic obstructive pulmonary disease, unspecified: Secondary | ICD-10-CM | POA: Diagnosis not present

## 2015-05-22 DIAGNOSIS — I5022 Chronic systolic (congestive) heart failure: Secondary | ICD-10-CM | POA: Diagnosis not present

## 2015-05-22 DIAGNOSIS — I2510 Atherosclerotic heart disease of native coronary artery without angina pectoris: Secondary | ICD-10-CM | POA: Diagnosis not present

## 2015-05-23 DIAGNOSIS — N189 Chronic kidney disease, unspecified: Secondary | ICD-10-CM | POA: Diagnosis not present

## 2015-05-23 DIAGNOSIS — J449 Chronic obstructive pulmonary disease, unspecified: Secondary | ICD-10-CM | POA: Diagnosis not present

## 2015-05-23 DIAGNOSIS — I5022 Chronic systolic (congestive) heart failure: Secondary | ICD-10-CM | POA: Diagnosis not present

## 2015-05-23 DIAGNOSIS — I2582 Chronic total occlusion of coronary artery: Secondary | ICD-10-CM | POA: Diagnosis not present

## 2015-05-23 DIAGNOSIS — I2510 Atherosclerotic heart disease of native coronary artery without angina pectoris: Secondary | ICD-10-CM | POA: Diagnosis not present

## 2015-05-23 DIAGNOSIS — I129 Hypertensive chronic kidney disease with stage 1 through stage 4 chronic kidney disease, or unspecified chronic kidney disease: Secondary | ICD-10-CM | POA: Diagnosis not present

## 2015-05-29 DIAGNOSIS — I129 Hypertensive chronic kidney disease with stage 1 through stage 4 chronic kidney disease, or unspecified chronic kidney disease: Secondary | ICD-10-CM | POA: Diagnosis not present

## 2015-05-29 DIAGNOSIS — I2510 Atherosclerotic heart disease of native coronary artery without angina pectoris: Secondary | ICD-10-CM | POA: Diagnosis not present

## 2015-05-29 DIAGNOSIS — N189 Chronic kidney disease, unspecified: Secondary | ICD-10-CM | POA: Diagnosis not present

## 2015-05-29 DIAGNOSIS — I5022 Chronic systolic (congestive) heart failure: Secondary | ICD-10-CM | POA: Diagnosis not present

## 2015-05-29 DIAGNOSIS — I2582 Chronic total occlusion of coronary artery: Secondary | ICD-10-CM | POA: Diagnosis not present

## 2015-05-29 DIAGNOSIS — J449 Chronic obstructive pulmonary disease, unspecified: Secondary | ICD-10-CM | POA: Diagnosis not present

## 2015-05-31 DIAGNOSIS — I2582 Chronic total occlusion of coronary artery: Secondary | ICD-10-CM | POA: Diagnosis not present

## 2015-05-31 DIAGNOSIS — I129 Hypertensive chronic kidney disease with stage 1 through stage 4 chronic kidney disease, or unspecified chronic kidney disease: Secondary | ICD-10-CM | POA: Diagnosis not present

## 2015-05-31 DIAGNOSIS — I2510 Atherosclerotic heart disease of native coronary artery without angina pectoris: Secondary | ICD-10-CM | POA: Diagnosis not present

## 2015-05-31 DIAGNOSIS — I5022 Chronic systolic (congestive) heart failure: Secondary | ICD-10-CM | POA: Diagnosis not present

## 2015-05-31 DIAGNOSIS — J449 Chronic obstructive pulmonary disease, unspecified: Secondary | ICD-10-CM | POA: Diagnosis not present

## 2015-05-31 DIAGNOSIS — N189 Chronic kidney disease, unspecified: Secondary | ICD-10-CM | POA: Diagnosis not present

## 2015-06-20 DIAGNOSIS — J449 Chronic obstructive pulmonary disease, unspecified: Secondary | ICD-10-CM | POA: Diagnosis not present

## 2015-06-20 DIAGNOSIS — E785 Hyperlipidemia, unspecified: Secondary | ICD-10-CM | POA: Diagnosis not present

## 2015-06-20 DIAGNOSIS — I2510 Atherosclerotic heart disease of native coronary artery without angina pectoris: Secondary | ICD-10-CM | POA: Diagnosis not present

## 2015-06-20 DIAGNOSIS — Z85110 Personal history of malignant carcinoid tumor of bronchus and lung: Secondary | ICD-10-CM | POA: Diagnosis not present

## 2015-06-20 DIAGNOSIS — I5022 Chronic systolic (congestive) heart failure: Secondary | ICD-10-CM | POA: Diagnosis not present

## 2015-06-20 DIAGNOSIS — I10 Essential (primary) hypertension: Secondary | ICD-10-CM | POA: Diagnosis not present

## 2015-06-20 DIAGNOSIS — M6281 Muscle weakness (generalized): Secondary | ICD-10-CM | POA: Diagnosis not present

## 2015-06-20 DIAGNOSIS — Z87891 Personal history of nicotine dependence: Secondary | ICD-10-CM | POA: Diagnosis not present

## 2015-06-21 DIAGNOSIS — E785 Hyperlipidemia, unspecified: Secondary | ICD-10-CM | POA: Diagnosis not present

## 2015-06-21 DIAGNOSIS — I2510 Atherosclerotic heart disease of native coronary artery without angina pectoris: Secondary | ICD-10-CM | POA: Diagnosis not present

## 2015-06-21 DIAGNOSIS — I5022 Chronic systolic (congestive) heart failure: Secondary | ICD-10-CM | POA: Diagnosis not present

## 2015-06-21 DIAGNOSIS — I10 Essential (primary) hypertension: Secondary | ICD-10-CM | POA: Diagnosis not present

## 2015-06-21 DIAGNOSIS — J449 Chronic obstructive pulmonary disease, unspecified: Secondary | ICD-10-CM | POA: Diagnosis not present

## 2015-06-21 DIAGNOSIS — M6281 Muscle weakness (generalized): Secondary | ICD-10-CM | POA: Diagnosis not present

## 2015-06-24 DIAGNOSIS — J449 Chronic obstructive pulmonary disease, unspecified: Secondary | ICD-10-CM | POA: Diagnosis not present

## 2015-06-24 DIAGNOSIS — I5022 Chronic systolic (congestive) heart failure: Secondary | ICD-10-CM | POA: Diagnosis not present

## 2015-06-24 DIAGNOSIS — E785 Hyperlipidemia, unspecified: Secondary | ICD-10-CM | POA: Diagnosis not present

## 2015-06-24 DIAGNOSIS — I10 Essential (primary) hypertension: Secondary | ICD-10-CM | POA: Diagnosis not present

## 2015-06-24 DIAGNOSIS — M6281 Muscle weakness (generalized): Secondary | ICD-10-CM | POA: Diagnosis not present

## 2015-06-24 DIAGNOSIS — I2510 Atherosclerotic heart disease of native coronary artery without angina pectoris: Secondary | ICD-10-CM | POA: Diagnosis not present

## 2015-06-26 DIAGNOSIS — I2510 Atherosclerotic heart disease of native coronary artery without angina pectoris: Secondary | ICD-10-CM | POA: Diagnosis not present

## 2015-06-26 DIAGNOSIS — I10 Essential (primary) hypertension: Secondary | ICD-10-CM | POA: Diagnosis not present

## 2015-06-26 DIAGNOSIS — J449 Chronic obstructive pulmonary disease, unspecified: Secondary | ICD-10-CM | POA: Diagnosis not present

## 2015-06-26 DIAGNOSIS — M6281 Muscle weakness (generalized): Secondary | ICD-10-CM | POA: Diagnosis not present

## 2015-06-26 DIAGNOSIS — E785 Hyperlipidemia, unspecified: Secondary | ICD-10-CM | POA: Diagnosis not present

## 2015-06-26 DIAGNOSIS — I5022 Chronic systolic (congestive) heart failure: Secondary | ICD-10-CM | POA: Diagnosis not present

## 2015-06-28 DIAGNOSIS — E785 Hyperlipidemia, unspecified: Secondary | ICD-10-CM | POA: Diagnosis not present

## 2015-06-28 DIAGNOSIS — I2510 Atherosclerotic heart disease of native coronary artery without angina pectoris: Secondary | ICD-10-CM | POA: Diagnosis not present

## 2015-06-28 DIAGNOSIS — J449 Chronic obstructive pulmonary disease, unspecified: Secondary | ICD-10-CM | POA: Diagnosis not present

## 2015-06-28 DIAGNOSIS — M6281 Muscle weakness (generalized): Secondary | ICD-10-CM | POA: Diagnosis not present

## 2015-06-28 DIAGNOSIS — I5022 Chronic systolic (congestive) heart failure: Secondary | ICD-10-CM | POA: Diagnosis not present

## 2015-06-28 DIAGNOSIS — I10 Essential (primary) hypertension: Secondary | ICD-10-CM | POA: Diagnosis not present

## 2015-07-02 ENCOUNTER — Emergency Department: Payer: Non-veteran care

## 2015-07-02 DIAGNOSIS — R197 Diarrhea, unspecified: Secondary | ICD-10-CM | POA: Insufficient documentation

## 2015-07-02 DIAGNOSIS — I5022 Chronic systolic (congestive) heart failure: Secondary | ICD-10-CM | POA: Diagnosis not present

## 2015-07-02 DIAGNOSIS — I129 Hypertensive chronic kidney disease with stage 1 through stage 4 chronic kidney disease, or unspecified chronic kidney disease: Secondary | ICD-10-CM | POA: Insufficient documentation

## 2015-07-02 DIAGNOSIS — I2510 Atherosclerotic heart disease of native coronary artery without angina pectoris: Secondary | ICD-10-CM | POA: Diagnosis not present

## 2015-07-02 DIAGNOSIS — E785 Hyperlipidemia, unspecified: Secondary | ICD-10-CM | POA: Diagnosis not present

## 2015-07-02 DIAGNOSIS — I10 Essential (primary) hypertension: Secondary | ICD-10-CM | POA: Diagnosis not present

## 2015-07-02 DIAGNOSIS — M6281 Muscle weakness (generalized): Secondary | ICD-10-CM | POA: Diagnosis not present

## 2015-07-02 DIAGNOSIS — N183 Chronic kidney disease, stage 3 (moderate): Secondary | ICD-10-CM | POA: Diagnosis not present

## 2015-07-02 DIAGNOSIS — J449 Chronic obstructive pulmonary disease, unspecified: Secondary | ICD-10-CM | POA: Diagnosis not present

## 2015-07-02 NOTE — ED Notes (Signed)
Pt presents to ED with diarrhea tonight. Pt states he had trouble having a bowel movement earlier this evening and was having abd discomfort. Reports a couple of hours ago he had diarrhea X1 but states he still feels constipated. Denies any abd pain or cramping at this time. Pt denies nausea.

## 2015-07-03 ENCOUNTER — Emergency Department
Admission: EM | Admit: 2015-07-03 | Discharge: 2015-07-03 | Disposition: A | Discharge status: Home / Self Care | Payer: Medicare Other | Attending: Emergency Medicine | Admitting: Emergency Medicine

## 2015-07-03 ENCOUNTER — Emergency Department
Admission: EM | Admit: 2015-07-03 | Discharge: 2015-07-03 | Discharge status: Against Medical Advice | Payer: Non-veteran care | Attending: Emergency Medicine | Admitting: Emergency Medicine

## 2015-07-03 ENCOUNTER — Encounter: Payer: Self-pay | Admitting: Emergency Medicine

## 2015-07-03 ENCOUNTER — Telehealth: Payer: Self-pay | Admitting: Emergency Medicine

## 2015-07-03 DIAGNOSIS — Z79899 Other long term (current) drug therapy: Secondary | ICD-10-CM | POA: Diagnosis not present

## 2015-07-03 DIAGNOSIS — Z7982 Long term (current) use of aspirin: Secondary | ICD-10-CM | POA: Insufficient documentation

## 2015-07-03 DIAGNOSIS — Z87891 Personal history of nicotine dependence: Secondary | ICD-10-CM | POA: Diagnosis not present

## 2015-07-03 DIAGNOSIS — Z7902 Long term (current) use of antithrombotics/antiplatelets: Secondary | ICD-10-CM | POA: Diagnosis not present

## 2015-07-03 DIAGNOSIS — N183 Chronic kidney disease, stage 3 (moderate): Secondary | ICD-10-CM | POA: Insufficient documentation

## 2015-07-03 DIAGNOSIS — I129 Hypertensive chronic kidney disease with stage 1 through stage 4 chronic kidney disease, or unspecified chronic kidney disease: Secondary | ICD-10-CM | POA: Insufficient documentation

## 2015-07-03 DIAGNOSIS — K5900 Constipation, unspecified: Secondary | ICD-10-CM | POA: Insufficient documentation

## 2015-07-03 MED ORDER — DOCUSATE SODIUM 100 MG PO CAPS: 100.0000 mg | ORAL_CAPSULE | Freq: Every day | ORAL | Status: AC | PRN

## 2015-07-03 MED ORDER — POLYETHYLENE GLYCOL 3350 17 G PO PACK: 17.0000 g | PACK | Freq: Every day | ORAL | Status: AC

## 2015-07-03 NOTE — ED Notes (Signed)
Called patient due to lwot to inquire about condition and follow up plans. Son in law answered and says caregiver family member ginger just left. He says he is not sure if patient had solid bowel movment. He says patient just had heart surgery and a nurse from [facility] is coming to see him.  i gave him my number for ginger to call me.

## 2015-07-03 NOTE — ED Notes (Signed)
Enema given. Patient could not hold fluid in. Dr. Alphonzo Lemmings aware.

## 2015-07-03 NOTE — Discharge Instructions (Signed)

## 2015-07-03 NOTE — ED Provider Notes (Addendum)
[facility] Emergency Department Provider Note  ____________________________________________  Time seen: Approximately 2:57 PM  I have reviewed the triage vital signs and the nursing notes.   HISTORY  Chief Complaint Constipation    HPI COY ROCHFORD is a 81 y.o. male who is here last night for constipation. X-ray showed some degree of impaction. His been going on for a day or 2. No prior history of this. No recollected colonoscopy in the past. No hematemesis or hematochezia or abdominal pain or melena or any other bleeding or other associated pathologies. No recent weight loss. The patient states he feels his normal self. She went home last night and had a large satisfying brown bowel movement and now does not have any complaints but he was called because of the x-ray and he came back for that reason.  Past Medical History  Diagnosis Date  . AAA (abdominal aortic aneurysm)     a. 4.4 x 4.5 cm on 04/27/2014; b. abd [place] 10/19/2014: 4.7 cm x 4.8 cm distal aorta, with mod thrombus & plaque burden. Heavily calcified aorto-iliac arteries, w/o stenosis, w/ ectatic abd aorta. Follow up 6 months (04/2015).     Marland Kitchen Hypertension   . Hyperlipidemia   . BPH (benign prostatic hypertrophy)   . Coronary artery disease     a. 2v CABG 1991 (LIMA-LAD, SVG-RCA), stenting LCx 2013; b. inferior STEMI 09/21/2014 cath:  LIMA-LAD is atretic w/ 90% ost LAD, acute occlusion SVG-RCA s/p PCI/DES x 2, patent LCx stent   . Acute renal insufficiency   . Tobacco abuse   . CKD (chronic kidney disease), stage III   . Bicuspid aortic valve     a. echo 09/21/14: EF 55-60%, Antseptal wall motion abn 2/2 prior CABG, images suggestive of bicuspid aortic valve, mild dilation of aortic root (3.6 cm) & ascending aorta, mild MR, impaired relaxation  . MI (myocardial infarction)     x3  . Thyroid disease   . Carotid artery occlusion   . Esophageal stricture     a. s/p multiple dilatations    Patient  Active Problem List   Diagnosis Date Noted  . AAA (abdominal aortic aneurysm)   . Hypertension   . Hyperlipidemia   . Coronary artery disease   . CKD (chronic kidney disease), stage III   . Tobacco abuse   . Bicuspid aortic valve   . Carotid artery occlusion   . Esophageal stricture     Past Surgical History  Procedure Laterality Date  . Coronary artery bypass graft  1991    CABG x 2   . Carotid endarterectomy    . Cardiac catheterization    . Coronary angioplasty  2013    s/p stenting to LCX    . Cardiac catheterization      x2 [facility] [place]  . Cardiac catheterization      x2 [facility] [place]  . Appendectomy      Current Outpatient Rx  Name  Route  Sig  Dispense  Refill  . amLODipine (NORVASC) 5 MG tablet   Oral   Take 1 tablet (5 mg total) by mouth daily.   180 tablet   3   . aspirin 81 MG tablet   Oral   Take 81 mg by mouth daily.         . carvedilol (COREG) 3.125 MG tablet   Oral   Take 1 tablet (3.125 mg total) by mouth 2 (two) times daily with  a meal.   60 tablet   6   . clopidogrel (PLAVIX) 75 MG tablet   Oral   Take 1 tablet (75 mg total) by mouth daily.   30 tablet   6   . furosemide (LASIX) 20 MG tablet   Oral   Take 1 tablet (20 mg total) by mouth daily.   30 tablet   6   . levothyroxine (SYNTHROID, LEVOTHROID) 25 MCG tablet   Oral   Take 25 mcg by mouth daily before breakfast.         . lisinopril (PRINIVIL,ZESTRIL) 5 MG tablet   Oral   Take 1 tablet (5 mg total) by mouth daily.   30 tablet   6   . nitroGLYCERIN (NITROSTAT) 0.4 MG SL tablet   Sublingual   Place 1 tablet (0.4 mg total) under the tongue every 5 (five) minutes as needed for chest pain.   25 tablet   6   . pravastatin (PRAVACHOL) 80 MG tablet   Oral   Take 1 tablet (80 mg total) by mouth daily.   30 tablet   6   . terazosin (HYTRIN) 2 MG capsule   Oral   Take 2 mg by mouth at bedtime.         . traZODone  (DESYREL) 50 MG tablet   Oral   Take 50 mg by mouth at bedtime.           Allergies Cerivastatin and Morphine  Family History  Problem Relation Age of Onset  . Heart failure Mother   . Heart failure Father     Social History Social History  Substance Use Topics  . Smoking status: Former Smoker -- 65 years    Types: Cigarettes  . Smokeless tobacco: None  . Alcohol Use: No    Review of Systems Constitutional: No fever/chills Eyes: No visual changes. ENT: No sore throat. No stiff neck no neck pain Cardiovascular: Denies chest pain. Respiratory: Denies shortness of breath. Gastrointestinal:   no vomiting.  No diarrhea.  No constipation. Genitourinary: Negative for dysuria. Musculoskeletal: Negative for back pain. Skin: Negative for rash. Neurological: Negative for headaches, focal weakness or numbness. 10-point ROS otherwise negative.  ____________________________________________   PHYSICAL EXAM:  VITAL SIGNS: ED Triage Vitals  Enc Vitals Group     BP 07/03/15 1431 153/46 mmHg     Pulse Rate 07/03/15 1431 70     Resp 07/03/15 1431 18     Temp 07/03/15 1431 98.8 F (37.1 C)     Temp Source 07/03/15 1431 Oral     SpO2 07/03/15 1431 97 %     Weight 07/03/15 1431 126 lb (57.153 kg)     Height 07/03/15 1431  (1.626 m)     Head Cir --      Peak Flow --      Pain Score 07/03/15 1432 0     Pain Loc --      Pain Edu? --      Excl. in GC? --     Constitutional: Alert and oriented. Well appearing and in no acute distress. Eyes: Conjunctivae are normal. PERRL. EOMI. Head: Atraumatic. Nose: No congestion/rhinnorhea. Mouth/Throat: Mucous membranes are moist.  Oropharynx non-erythematous. Neck: No stridor.   Nontender with no meningismus Cardiovascular: Normal rate, regular rhythm. Grossly normal heart sounds.  Good peripheral circulation. Respiratory: Normal respiratory effort.  No retractions. Lungs CTAB. Gastrointestinal: Soft and nontender. No distention.  No abdominal bruits. No CVA tenderness. Back: Nontender to  palpation no obvious lesion step-off or deformity Rectal exam: There is still stool in the vault, at the tip of my finger. Guaiac-negative no bleeding or mass palpated Musculoskeletal: No lower extremity tenderness. No joint effusions, no DVT signs strong distal pulses no edema Neurologic:  Normal speech and language. No gross focal neurologic deficits are appreciated.  Skin:  Skin is warm, dry and intact. No rash noted. Psychiatric: Mood and affect are normal. Speech and behavior are normal.  ____________________________________________   LABS (all labs ordered are listed, but only abnormal results are displayed)  Labs Reviewed - No data to display ____________________________________________  EKG   ____________________________________________  RADIOLOGY  I have reviewed last night's x-ray ____________________________________________   PROCEDURES  Procedure(s) performed: None  Critical Care performed: None  ____________________________________________   INITIAL IMPRESSION / ASSESSMENT AND PLAN / ED COURSE  Pertinent labs & imaging results that were available during my care of the patient were reviewed by me and considered in my medical decision making (see chart for details).  Patient here for constipation. He did have a large bowel movement yesterday feels better. I have offered him 2 options, the first is an enema here as I suspect he still has a large fecal load, or the second his home oral medications. Patient would prefer to try an enema at this time although he is a symptomatically as were the constipation worsened again. I think given his x-ray that is not an unreasonable supposition especially as I do feel stool. We will give him an enema and then start him on oral medications. Patient is in no acute distress there is no evidence of obstruction or any other pathology apparent but we will have him follow  closely with his primary care doctor for outpatient GI follow-up. Should this recur or should there be any other new or worrisome symptoms he understands he must return to the emergency department. ____________________________________________  ----------------------------------------- 3:13 PM on 07/03/2015 -----------------------------------------  The patient was given a soapsuds enema with good results. We will send him home with stool softeners and close follow-up with GI. Serial abdominal exams are negative.    FINAL CLINICAL IMPRESSION(S) / ED DIAGNOSES  Final diagnoses:  None     Jeanmarie Plant, MD 07/03/15 1459  Jeanmarie Plant, MD 07/03/15 [phone]

## 2015-07-03 NOTE — ED Notes (Signed)
Hemoccult performed by Dr. Alphonzo Lemmings.

## 2015-07-03 NOTE — ED Notes (Signed)
Pt presents to ED with c/o "I don't know what's wrong but my bowels..." Pt reports that he has been having very small, hard bowel movements "off and on all night." Pt reports pain with BM, denies noticing any blood. Pt states, "it's like barbed wire coming out." Pt denies abdominal pain. Denies nausea vomiting. Denies urinary symptoms. Pt is awake and alert during triage. Son in Social worker with pt.

## 2015-07-05 DIAGNOSIS — I2510 Atherosclerotic heart disease of native coronary artery without angina pectoris: Secondary | ICD-10-CM | POA: Diagnosis not present

## 2015-07-05 DIAGNOSIS — I5022 Chronic systolic (congestive) heart failure: Secondary | ICD-10-CM | POA: Diagnosis not present

## 2015-07-05 DIAGNOSIS — I10 Essential (primary) hypertension: Secondary | ICD-10-CM | POA: Diagnosis not present

## 2015-07-05 DIAGNOSIS — M6281 Muscle weakness (generalized): Secondary | ICD-10-CM | POA: Diagnosis not present

## 2015-07-05 DIAGNOSIS — J449 Chronic obstructive pulmonary disease, unspecified: Secondary | ICD-10-CM | POA: Diagnosis not present

## 2015-07-05 DIAGNOSIS — E785 Hyperlipidemia, unspecified: Secondary | ICD-10-CM | POA: Diagnosis not present

## 2015-07-12 ENCOUNTER — Telehealth: Payer: Self-pay | Admitting: *Deleted

## 2015-07-12 NOTE — Telephone Encounter (Signed)
Lmom to call our office. Time to schedule a Aorta u/s (6 mth fu).

## 2015-07-17 DIAGNOSIS — I2510 Atherosclerotic heart disease of native coronary artery without angina pectoris: Secondary | ICD-10-CM | POA: Diagnosis not present

## 2015-07-17 DIAGNOSIS — J449 Chronic obstructive pulmonary disease, unspecified: Secondary | ICD-10-CM | POA: Diagnosis not present

## 2015-07-17 DIAGNOSIS — I10 Essential (primary) hypertension: Secondary | ICD-10-CM | POA: Diagnosis not present

## 2015-07-17 DIAGNOSIS — M6281 Muscle weakness (generalized): Secondary | ICD-10-CM | POA: Diagnosis not present

## 2015-07-17 DIAGNOSIS — I5022 Chronic systolic (congestive) heart failure: Secondary | ICD-10-CM | POA: Diagnosis not present

## 2015-07-17 DIAGNOSIS — E785 Hyperlipidemia, unspecified: Secondary | ICD-10-CM | POA: Diagnosis not present

## 2015-07-19 DIAGNOSIS — M6281 Muscle weakness (generalized): Secondary | ICD-10-CM | POA: Diagnosis not present

## 2015-07-19 DIAGNOSIS — E785 Hyperlipidemia, unspecified: Secondary | ICD-10-CM | POA: Diagnosis not present

## 2015-07-19 DIAGNOSIS — I10 Essential (primary) hypertension: Secondary | ICD-10-CM | POA: Diagnosis not present

## 2015-07-19 DIAGNOSIS — J449 Chronic obstructive pulmonary disease, unspecified: Secondary | ICD-10-CM | POA: Diagnosis not present

## 2015-07-19 DIAGNOSIS — I5022 Chronic systolic (congestive) heart failure: Secondary | ICD-10-CM | POA: Diagnosis not present

## 2015-07-19 DIAGNOSIS — I2510 Atherosclerotic heart disease of native coronary artery without angina pectoris: Secondary | ICD-10-CM | POA: Diagnosis not present

## 2015-07-20 ENCOUNTER — Telehealth: Payer: Self-pay | Admitting: *Deleted

## 2015-07-20 NOTE — Telephone Encounter (Signed)
Lmom to call our office for a abd aorta u/s (6 mth f/u, past due).

## 2015-08-01 ENCOUNTER — Encounter: Payer: Self-pay | Admitting: *Deleted

## 2015-11-02 DIAGNOSIS — R079 Chest pain, unspecified: Secondary | ICD-10-CM | POA: Diagnosis not present

## 2015-11-02 DIAGNOSIS — R9439 Abnormal result of other cardiovascular function study: Secondary | ICD-10-CM | POA: Diagnosis not present

## 2015-11-02 DIAGNOSIS — Z8679 Personal history of other diseases of the circulatory system: Secondary | ICD-10-CM | POA: Diagnosis not present

## 2015-11-02 DIAGNOSIS — R0602 Shortness of breath: Secondary | ICD-10-CM | POA: Diagnosis not present

## 2015-11-02 DIAGNOSIS — N179 Acute kidney failure, unspecified: Secondary | ICD-10-CM | POA: Diagnosis not present

## 2015-11-02 DIAGNOSIS — E785 Hyperlipidemia, unspecified: Secondary | ICD-10-CM | POA: Diagnosis present

## 2015-11-02 DIAGNOSIS — I249 Acute ischemic heart disease, unspecified: Secondary | ICD-10-CM | POA: Diagnosis not present

## 2015-11-02 DIAGNOSIS — Z955 Presence of coronary angioplasty implant and graft: Secondary | ICD-10-CM | POA: Diagnosis not present

## 2015-11-02 DIAGNOSIS — Z7902 Long term (current) use of antithrombotics/antiplatelets: Secondary | ICD-10-CM | POA: Diagnosis not present

## 2015-11-02 DIAGNOSIS — Z7982 Long term (current) use of aspirin: Secondary | ICD-10-CM | POA: Diagnosis not present

## 2015-11-02 DIAGNOSIS — Z66 Do not resuscitate: Secondary | ICD-10-CM | POA: Diagnosis present

## 2015-11-02 DIAGNOSIS — M1990 Unspecified osteoarthritis, unspecified site: Secondary | ICD-10-CM | POA: Diagnosis present

## 2015-11-02 DIAGNOSIS — I2510 Atherosclerotic heart disease of native coronary artery without angina pectoris: Secondary | ICD-10-CM | POA: Diagnosis not present

## 2015-11-02 DIAGNOSIS — N189 Chronic kidney disease, unspecified: Secondary | ICD-10-CM | POA: Diagnosis not present

## 2015-11-02 DIAGNOSIS — I272 Other secondary pulmonary hypertension: Secondary | ICD-10-CM | POA: Diagnosis not present

## 2015-11-02 DIAGNOSIS — E870 Hyperosmolality and hypernatremia: Secondary | ICD-10-CM | POA: Diagnosis not present

## 2015-11-02 DIAGNOSIS — I214 Non-ST elevation (NSTEMI) myocardial infarction: Secondary | ICD-10-CM | POA: Diagnosis not present

## 2015-11-02 DIAGNOSIS — I130 Hypertensive heart and chronic kidney disease with heart failure and stage 1 through stage 4 chronic kidney disease, or unspecified chronic kidney disease: Secondary | ICD-10-CM | POA: Diagnosis not present

## 2015-11-02 DIAGNOSIS — Z9221 Personal history of antineoplastic chemotherapy: Secondary | ICD-10-CM | POA: Diagnosis not present

## 2015-11-02 DIAGNOSIS — I43 Cardiomyopathy in diseases classified elsewhere: Secondary | ICD-10-CM | POA: Diagnosis not present

## 2015-11-02 DIAGNOSIS — F17210 Nicotine dependence, cigarettes, uncomplicated: Secondary | ICD-10-CM | POA: Diagnosis present

## 2015-11-02 DIAGNOSIS — Z85118 Personal history of other malignant neoplasm of bronchus and lung: Secondary | ICD-10-CM | POA: Diagnosis not present

## 2015-11-02 DIAGNOSIS — I509 Heart failure, unspecified: Secondary | ICD-10-CM | POA: Diagnosis not present

## 2015-11-02 DIAGNOSIS — M810 Age-related osteoporosis without current pathological fracture: Secondary | ICD-10-CM | POA: Diagnosis present

## 2015-11-02 DIAGNOSIS — R918 Other nonspecific abnormal finding of lung field: Secondary | ICD-10-CM | POA: Diagnosis not present

## 2015-11-02 DIAGNOSIS — I25118 Atherosclerotic heart disease of native coronary artery with other forms of angina pectoris: Secondary | ICD-10-CM | POA: Diagnosis not present

## 2015-11-02 DIAGNOSIS — I252 Old myocardial infarction: Secondary | ICD-10-CM | POA: Diagnosis not present

## 2015-11-02 DIAGNOSIS — R0789 Other chest pain: Secondary | ICD-10-CM | POA: Diagnosis not present

## 2015-11-02 DIAGNOSIS — I739 Peripheral vascular disease, unspecified: Secondary | ICD-10-CM | POA: Diagnosis present

## 2015-11-02 DIAGNOSIS — N183 Chronic kidney disease, stage 3 (moderate): Secondary | ICD-10-CM | POA: Diagnosis present

## 2015-11-02 DIAGNOSIS — J449 Chronic obstructive pulmonary disease, unspecified: Secondary | ICD-10-CM | POA: Diagnosis not present

## 2015-11-02 DIAGNOSIS — D649 Anemia, unspecified: Secondary | ICD-10-CM | POA: Diagnosis present

## 2015-11-02 DIAGNOSIS — J9811 Atelectasis: Secondary | ICD-10-CM | POA: Diagnosis not present

## 2015-11-02 DIAGNOSIS — J811 Chronic pulmonary edema: Secondary | ICD-10-CM | POA: Diagnosis not present

## 2015-11-02 DIAGNOSIS — I700 Atherosclerosis of aorta: Secondary | ICD-10-CM | POA: Diagnosis not present

## 2015-11-02 DIAGNOSIS — J90 Pleural effusion, not elsewhere classified: Secondary | ICD-10-CM | POA: Diagnosis not present

## 2015-11-02 DIAGNOSIS — E7800 Pure hypercholesterolemia, unspecified: Secondary | ICD-10-CM | POA: Diagnosis present

## 2015-11-02 DIAGNOSIS — Z951 Presence of aortocoronary bypass graft: Secondary | ICD-10-CM | POA: Diagnosis not present

## 2015-11-03 DIAGNOSIS — R079 Chest pain, unspecified: Secondary | ICD-10-CM | POA: Diagnosis not present

## 2015-11-03 DIAGNOSIS — I43 Cardiomyopathy in diseases classified elsewhere: Secondary | ICD-10-CM | POA: Diagnosis not present

## 2015-11-03 DIAGNOSIS — I25118 Atherosclerotic heart disease of native coronary artery with other forms of angina pectoris: Secondary | ICD-10-CM | POA: Diagnosis not present

## 2015-11-09 DIAGNOSIS — I700 Atherosclerosis of aorta: Secondary | ICD-10-CM | POA: Diagnosis not present

## 2015-11-09 DIAGNOSIS — J9811 Atelectasis: Secondary | ICD-10-CM | POA: Diagnosis not present

## 2015-11-09 DIAGNOSIS — J90 Pleural effusion, not elsewhere classified: Secondary | ICD-10-CM | POA: Diagnosis not present

## 2015-11-09 DIAGNOSIS — R918 Other nonspecific abnormal finding of lung field: Secondary | ICD-10-CM | POA: Diagnosis not present

## 2015-11-14 DIAGNOSIS — I872 Venous insufficiency (chronic) (peripheral): Secondary | ICD-10-CM | POA: Diagnosis not present

## 2015-11-14 DIAGNOSIS — Z959 Presence of cardiac and vascular implant and graft, unspecified: Secondary | ICD-10-CM | POA: Diagnosis not present

## 2015-11-14 DIAGNOSIS — I130 Hypertensive heart and chronic kidney disease with heart failure and stage 1 through stage 4 chronic kidney disease, or unspecified chronic kidney disease: Secondary | ICD-10-CM | POA: Diagnosis not present

## 2015-11-14 DIAGNOSIS — I2510 Atherosclerotic heart disease of native coronary artery without angina pectoris: Secondary | ICD-10-CM | POA: Diagnosis not present

## 2015-11-14 DIAGNOSIS — Z720 Tobacco use: Secondary | ICD-10-CM | POA: Diagnosis not present

## 2015-11-14 DIAGNOSIS — N189 Chronic kidney disease, unspecified: Secondary | ICD-10-CM | POA: Diagnosis not present

## 2015-11-14 DIAGNOSIS — F339 Major depressive disorder, recurrent, unspecified: Secondary | ICD-10-CM | POA: Diagnosis not present

## 2015-11-14 DIAGNOSIS — Z87891 Personal history of nicotine dependence: Secondary | ICD-10-CM | POA: Diagnosis not present

## 2015-11-14 DIAGNOSIS — I214 Non-ST elevation (NSTEMI) myocardial infarction: Secondary | ICD-10-CM | POA: Diagnosis not present

## 2015-11-14 DIAGNOSIS — J449 Chronic obstructive pulmonary disease, unspecified: Secondary | ICD-10-CM | POA: Diagnosis not present

## 2015-11-14 DIAGNOSIS — I5022 Chronic systolic (congestive) heart failure: Secondary | ICD-10-CM | POA: Diagnosis not present

## 2015-11-14 DIAGNOSIS — I739 Peripheral vascular disease, unspecified: Secondary | ICD-10-CM | POA: Diagnosis not present

## 2015-11-21 DIAGNOSIS — I2510 Atherosclerotic heart disease of native coronary artery without angina pectoris: Secondary | ICD-10-CM | POA: Diagnosis not present

## 2015-11-21 DIAGNOSIS — I5022 Chronic systolic (congestive) heart failure: Secondary | ICD-10-CM | POA: Diagnosis not present

## 2015-11-21 DIAGNOSIS — I739 Peripheral vascular disease, unspecified: Secondary | ICD-10-CM | POA: Diagnosis not present

## 2015-11-21 DIAGNOSIS — I214 Non-ST elevation (NSTEMI) myocardial infarction: Secondary | ICD-10-CM | POA: Diagnosis not present

## 2015-11-21 DIAGNOSIS — I130 Hypertensive heart and chronic kidney disease with heart failure and stage 1 through stage 4 chronic kidney disease, or unspecified chronic kidney disease: Secondary | ICD-10-CM | POA: Diagnosis not present

## 2015-11-21 DIAGNOSIS — N189 Chronic kidney disease, unspecified: Secondary | ICD-10-CM | POA: Diagnosis not present

## 2015-11-23 DIAGNOSIS — I739 Peripheral vascular disease, unspecified: Secondary | ICD-10-CM | POA: Diagnosis not present

## 2015-11-23 DIAGNOSIS — I5022 Chronic systolic (congestive) heart failure: Secondary | ICD-10-CM | POA: Diagnosis not present

## 2015-11-23 DIAGNOSIS — I130 Hypertensive heart and chronic kidney disease with heart failure and stage 1 through stage 4 chronic kidney disease, or unspecified chronic kidney disease: Secondary | ICD-10-CM | POA: Diagnosis not present

## 2015-11-23 DIAGNOSIS — I2510 Atherosclerotic heart disease of native coronary artery without angina pectoris: Secondary | ICD-10-CM | POA: Diagnosis not present

## 2015-11-23 DIAGNOSIS — N189 Chronic kidney disease, unspecified: Secondary | ICD-10-CM | POA: Diagnosis not present

## 2015-11-23 DIAGNOSIS — I214 Non-ST elevation (NSTEMI) myocardial infarction: Secondary | ICD-10-CM | POA: Diagnosis not present

## 2015-11-28 DIAGNOSIS — I2510 Atherosclerotic heart disease of native coronary artery without angina pectoris: Secondary | ICD-10-CM | POA: Diagnosis not present

## 2015-11-28 DIAGNOSIS — N189 Chronic kidney disease, unspecified: Secondary | ICD-10-CM | POA: Diagnosis not present

## 2015-11-28 DIAGNOSIS — I130 Hypertensive heart and chronic kidney disease with heart failure and stage 1 through stage 4 chronic kidney disease, or unspecified chronic kidney disease: Secondary | ICD-10-CM | POA: Diagnosis not present

## 2015-11-28 DIAGNOSIS — I214 Non-ST elevation (NSTEMI) myocardial infarction: Secondary | ICD-10-CM | POA: Diagnosis not present

## 2015-11-28 DIAGNOSIS — I5022 Chronic systolic (congestive) heart failure: Secondary | ICD-10-CM | POA: Diagnosis not present

## 2015-11-28 DIAGNOSIS — I739 Peripheral vascular disease, unspecified: Secondary | ICD-10-CM | POA: Diagnosis not present

## 2015-12-03 DIAGNOSIS — I739 Peripheral vascular disease, unspecified: Secondary | ICD-10-CM | POA: Diagnosis not present

## 2015-12-03 DIAGNOSIS — I130 Hypertensive heart and chronic kidney disease with heart failure and stage 1 through stage 4 chronic kidney disease, or unspecified chronic kidney disease: Secondary | ICD-10-CM | POA: Diagnosis not present

## 2015-12-03 DIAGNOSIS — I5022 Chronic systolic (congestive) heart failure: Secondary | ICD-10-CM | POA: Diagnosis not present

## 2015-12-03 DIAGNOSIS — I214 Non-ST elevation (NSTEMI) myocardial infarction: Secondary | ICD-10-CM | POA: Diagnosis not present

## 2015-12-03 DIAGNOSIS — N189 Chronic kidney disease, unspecified: Secondary | ICD-10-CM | POA: Diagnosis not present

## 2015-12-03 DIAGNOSIS — I2510 Atherosclerotic heart disease of native coronary artery without angina pectoris: Secondary | ICD-10-CM | POA: Diagnosis not present

## 2016-01-09 DIAGNOSIS — I739 Peripheral vascular disease, unspecified: Secondary | ICD-10-CM | POA: Diagnosis not present

## 2016-01-09 DIAGNOSIS — I130 Hypertensive heart and chronic kidney disease with heart failure and stage 1 through stage 4 chronic kidney disease, or unspecified chronic kidney disease: Secondary | ICD-10-CM | POA: Diagnosis not present

## 2016-01-09 DIAGNOSIS — I214 Non-ST elevation (NSTEMI) myocardial infarction: Secondary | ICD-10-CM | POA: Diagnosis not present

## 2016-01-09 DIAGNOSIS — N189 Chronic kidney disease, unspecified: Secondary | ICD-10-CM | POA: Diagnosis not present

## 2016-01-09 DIAGNOSIS — I2510 Atherosclerotic heart disease of native coronary artery without angina pectoris: Secondary | ICD-10-CM | POA: Diagnosis not present

## 2016-01-09 DIAGNOSIS — I5022 Chronic systolic (congestive) heart failure: Secondary | ICD-10-CM | POA: Diagnosis not present

## 2016-01-13 DIAGNOSIS — I739 Peripheral vascular disease, unspecified: Secondary | ICD-10-CM | POA: Diagnosis not present

## 2016-01-13 DIAGNOSIS — I2510 Atherosclerotic heart disease of native coronary artery without angina pectoris: Secondary | ICD-10-CM | POA: Diagnosis not present

## 2016-01-13 DIAGNOSIS — Z87891 Personal history of nicotine dependence: Secondary | ICD-10-CM | POA: Diagnosis not present

## 2016-01-13 DIAGNOSIS — F339 Major depressive disorder, recurrent, unspecified: Secondary | ICD-10-CM | POA: Diagnosis not present

## 2016-01-13 DIAGNOSIS — Z959 Presence of cardiac and vascular implant and graft, unspecified: Secondary | ICD-10-CM | POA: Diagnosis not present

## 2016-01-13 DIAGNOSIS — I5022 Chronic systolic (congestive) heart failure: Secondary | ICD-10-CM | POA: Diagnosis not present

## 2016-01-13 DIAGNOSIS — I130 Hypertensive heart and chronic kidney disease with heart failure and stage 1 through stage 4 chronic kidney disease, or unspecified chronic kidney disease: Secondary | ICD-10-CM | POA: Diagnosis not present

## 2016-01-13 DIAGNOSIS — N189 Chronic kidney disease, unspecified: Secondary | ICD-10-CM | POA: Diagnosis not present

## 2016-01-13 DIAGNOSIS — J449 Chronic obstructive pulmonary disease, unspecified: Secondary | ICD-10-CM | POA: Diagnosis not present

## 2016-01-13 DIAGNOSIS — Z720 Tobacco use: Secondary | ICD-10-CM | POA: Diagnosis not present

## 2016-01-13 DIAGNOSIS — I872 Venous insufficiency (chronic) (peripheral): Secondary | ICD-10-CM | POA: Diagnosis not present

## 2016-02-23 DIAGNOSIS — I130 Hypertensive heart and chronic kidney disease with heart failure and stage 1 through stage 4 chronic kidney disease, or unspecified chronic kidney disease: Secondary | ICD-10-CM | POA: Diagnosis not present

## 2016-02-23 DIAGNOSIS — J449 Chronic obstructive pulmonary disease, unspecified: Secondary | ICD-10-CM | POA: Diagnosis not present

## 2016-02-23 DIAGNOSIS — I872 Venous insufficiency (chronic) (peripheral): Secondary | ICD-10-CM | POA: Diagnosis not present

## 2016-02-23 DIAGNOSIS — I739 Peripheral vascular disease, unspecified: Secondary | ICD-10-CM | POA: Diagnosis not present

## 2016-02-23 DIAGNOSIS — N189 Chronic kidney disease, unspecified: Secondary | ICD-10-CM | POA: Diagnosis not present

## 2016-02-23 DIAGNOSIS — I5022 Chronic systolic (congestive) heart failure: Secondary | ICD-10-CM | POA: Diagnosis not present

## 2016-03-05 DIAGNOSIS — I739 Peripheral vascular disease, unspecified: Secondary | ICD-10-CM | POA: Diagnosis not present

## 2016-03-05 DIAGNOSIS — I5022 Chronic systolic (congestive) heart failure: Secondary | ICD-10-CM | POA: Diagnosis not present

## 2016-03-05 DIAGNOSIS — I872 Venous insufficiency (chronic) (peripheral): Secondary | ICD-10-CM | POA: Diagnosis not present

## 2016-03-05 DIAGNOSIS — N189 Chronic kidney disease, unspecified: Secondary | ICD-10-CM | POA: Diagnosis not present

## 2016-03-05 DIAGNOSIS — J449 Chronic obstructive pulmonary disease, unspecified: Secondary | ICD-10-CM | POA: Diagnosis not present

## 2016-03-05 DIAGNOSIS — I130 Hypertensive heart and chronic kidney disease with heart failure and stage 1 through stage 4 chronic kidney disease, or unspecified chronic kidney disease: Secondary | ICD-10-CM | POA: Diagnosis not present

## 2016-03-11 DIAGNOSIS — I739 Peripheral vascular disease, unspecified: Secondary | ICD-10-CM | POA: Diagnosis not present

## 2016-03-11 DIAGNOSIS — J449 Chronic obstructive pulmonary disease, unspecified: Secondary | ICD-10-CM | POA: Diagnosis not present

## 2016-03-11 DIAGNOSIS — N189 Chronic kidney disease, unspecified: Secondary | ICD-10-CM | POA: Diagnosis not present

## 2016-03-11 DIAGNOSIS — I130 Hypertensive heart and chronic kidney disease with heart failure and stage 1 through stage 4 chronic kidney disease, or unspecified chronic kidney disease: Secondary | ICD-10-CM | POA: Diagnosis not present

## 2016-03-11 DIAGNOSIS — I5022 Chronic systolic (congestive) heart failure: Secondary | ICD-10-CM | POA: Diagnosis not present

## 2016-03-11 DIAGNOSIS — I872 Venous insufficiency (chronic) (peripheral): Secondary | ICD-10-CM | POA: Diagnosis not present

## 2016-03-13 DIAGNOSIS — I5022 Chronic systolic (congestive) heart failure: Secondary | ICD-10-CM | POA: Diagnosis not present

## 2016-03-13 DIAGNOSIS — Z87891 Personal history of nicotine dependence: Secondary | ICD-10-CM | POA: Diagnosis not present

## 2016-03-13 DIAGNOSIS — F339 Major depressive disorder, recurrent, unspecified: Secondary | ICD-10-CM | POA: Diagnosis not present

## 2016-03-13 DIAGNOSIS — Z959 Presence of cardiac and vascular implant and graft, unspecified: Secondary | ICD-10-CM | POA: Diagnosis not present

## 2016-03-13 DIAGNOSIS — I739 Peripheral vascular disease, unspecified: Secondary | ICD-10-CM | POA: Diagnosis not present

## 2016-03-13 DIAGNOSIS — I130 Hypertensive heart and chronic kidney disease with heart failure and stage 1 through stage 4 chronic kidney disease, or unspecified chronic kidney disease: Secondary | ICD-10-CM | POA: Diagnosis not present

## 2016-03-13 DIAGNOSIS — I2510 Atherosclerotic heart disease of native coronary artery without angina pectoris: Secondary | ICD-10-CM | POA: Diagnosis not present

## 2016-03-13 DIAGNOSIS — J449 Chronic obstructive pulmonary disease, unspecified: Secondary | ICD-10-CM | POA: Diagnosis not present

## 2016-03-13 DIAGNOSIS — N189 Chronic kidney disease, unspecified: Secondary | ICD-10-CM | POA: Diagnosis not present

## 2016-03-13 DIAGNOSIS — I872 Venous insufficiency (chronic) (peripheral): Secondary | ICD-10-CM | POA: Diagnosis not present

## 2016-03-13 DIAGNOSIS — Z720 Tobacco use: Secondary | ICD-10-CM | POA: Diagnosis not present

## 2016-04-19 IMAGING — CR DG ABDOMEN 1V
1 series · 1 of 1 positions shown · non-contrast
Comparison: None.

CLINICAL DATA: Constipation and abdominal discomfort.

EXAM:
ABDOMEN - 1 VIEW

[abdomen kub]
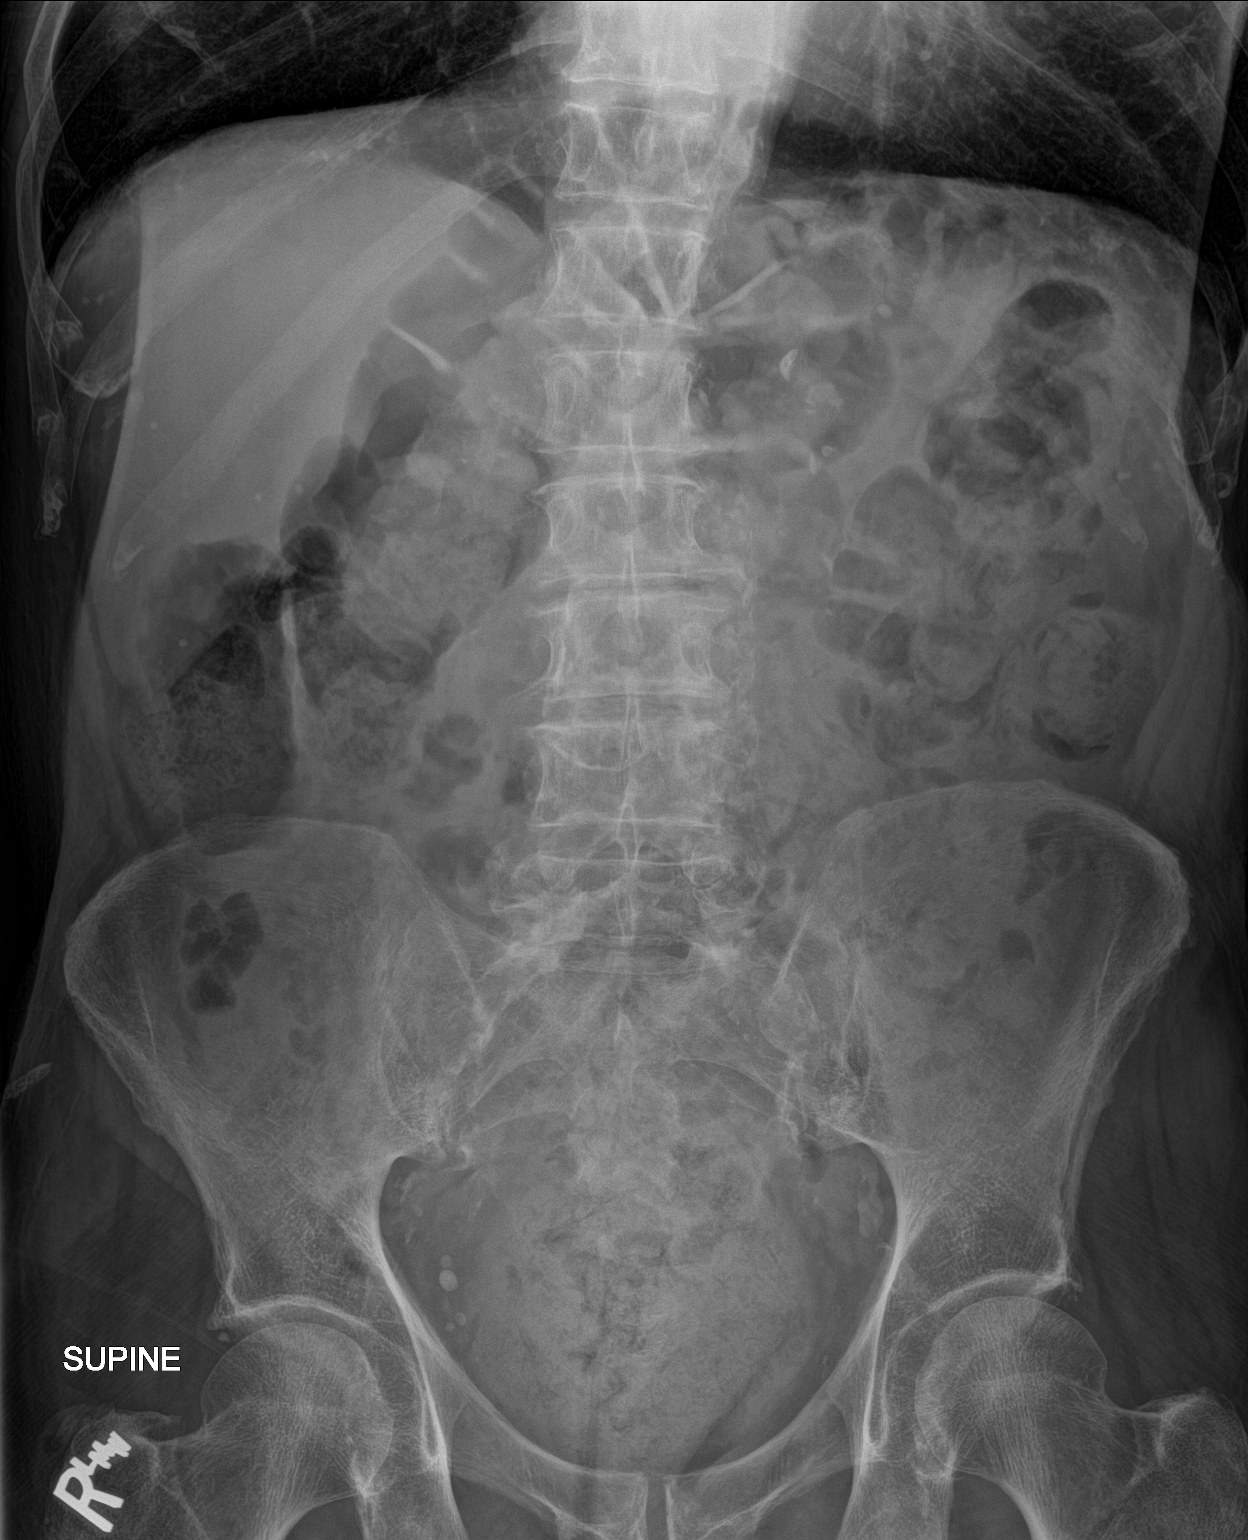

[1 of 1 positions shown; findings below may reference images not displayed]

FINDINGS: Large volume formed stool throughout most colonic segments, with
rectal distention to 9 cm. No indication of bowel obstruction.

Arterial and venous calcification. Probable splenic and hepatic
granulomas. No indication of urolithiasis. No concerning
intra-abdominal mass effect.
IMPRESSION: Large volume stool with rectal impaction.

## 2016-04-24 DIAGNOSIS — N189 Chronic kidney disease, unspecified: Secondary | ICD-10-CM | POA: Diagnosis not present

## 2016-04-24 DIAGNOSIS — I5022 Chronic systolic (congestive) heart failure: Secondary | ICD-10-CM | POA: Diagnosis not present

## 2016-04-24 DIAGNOSIS — I739 Peripheral vascular disease, unspecified: Secondary | ICD-10-CM | POA: Diagnosis not present

## 2016-04-24 DIAGNOSIS — J449 Chronic obstructive pulmonary disease, unspecified: Secondary | ICD-10-CM | POA: Diagnosis not present

## 2016-04-24 DIAGNOSIS — I130 Hypertensive heart and chronic kidney disease with heart failure and stage 1 through stage 4 chronic kidney disease, or unspecified chronic kidney disease: Secondary | ICD-10-CM | POA: Diagnosis not present

## 2016-04-24 DIAGNOSIS — I872 Venous insufficiency (chronic) (peripheral): Secondary | ICD-10-CM | POA: Diagnosis not present

## 2016-04-28 DIAGNOSIS — I872 Venous insufficiency (chronic) (peripheral): Secondary | ICD-10-CM | POA: Diagnosis not present

## 2016-04-28 DIAGNOSIS — N189 Chronic kidney disease, unspecified: Secondary | ICD-10-CM | POA: Diagnosis not present

## 2016-04-28 DIAGNOSIS — I130 Hypertensive heart and chronic kidney disease with heart failure and stage 1 through stage 4 chronic kidney disease, or unspecified chronic kidney disease: Secondary | ICD-10-CM | POA: Diagnosis not present

## 2016-04-28 DIAGNOSIS — J449 Chronic obstructive pulmonary disease, unspecified: Secondary | ICD-10-CM | POA: Diagnosis not present

## 2016-04-28 DIAGNOSIS — I5022 Chronic systolic (congestive) heart failure: Secondary | ICD-10-CM | POA: Diagnosis not present

## 2016-04-28 DIAGNOSIS — I739 Peripheral vascular disease, unspecified: Secondary | ICD-10-CM | POA: Diagnosis not present

## 2016-05-07 DIAGNOSIS — I872 Venous insufficiency (chronic) (peripheral): Secondary | ICD-10-CM | POA: Diagnosis not present

## 2016-05-07 DIAGNOSIS — I739 Peripheral vascular disease, unspecified: Secondary | ICD-10-CM | POA: Diagnosis not present

## 2016-05-07 DIAGNOSIS — I5022 Chronic systolic (congestive) heart failure: Secondary | ICD-10-CM | POA: Diagnosis not present

## 2016-05-07 DIAGNOSIS — I130 Hypertensive heart and chronic kidney disease with heart failure and stage 1 through stage 4 chronic kidney disease, or unspecified chronic kidney disease: Secondary | ICD-10-CM | POA: Diagnosis not present

## 2016-05-07 DIAGNOSIS — J449 Chronic obstructive pulmonary disease, unspecified: Secondary | ICD-10-CM | POA: Diagnosis not present

## 2016-05-07 DIAGNOSIS — N189 Chronic kidney disease, unspecified: Secondary | ICD-10-CM | POA: Diagnosis not present

## 2016-06-05 DIAGNOSIS — I130 Hypertensive heart and chronic kidney disease with heart failure and stage 1 through stage 4 chronic kidney disease, or unspecified chronic kidney disease: Secondary | ICD-10-CM | POA: Diagnosis not present

## 2016-06-05 DIAGNOSIS — Z720 Tobacco use: Secondary | ICD-10-CM | POA: Diagnosis not present

## 2016-06-05 DIAGNOSIS — I5022 Chronic systolic (congestive) heart failure: Secondary | ICD-10-CM | POA: Diagnosis not present

## 2016-06-05 DIAGNOSIS — N189 Chronic kidney disease, unspecified: Secondary | ICD-10-CM | POA: Diagnosis not present

## 2016-06-05 DIAGNOSIS — F339 Major depressive disorder, recurrent, unspecified: Secondary | ICD-10-CM | POA: Diagnosis not present

## 2016-06-05 DIAGNOSIS — Z9181 History of falling: Secondary | ICD-10-CM | POA: Diagnosis not present

## 2016-06-05 DIAGNOSIS — I2510 Atherosclerotic heart disease of native coronary artery without angina pectoris: Secondary | ICD-10-CM | POA: Diagnosis not present

## 2016-06-05 DIAGNOSIS — J449 Chronic obstructive pulmonary disease, unspecified: Secondary | ICD-10-CM | POA: Diagnosis not present

## 2016-06-12 DIAGNOSIS — I130 Hypertensive heart and chronic kidney disease with heart failure and stage 1 through stage 4 chronic kidney disease, or unspecified chronic kidney disease: Secondary | ICD-10-CM | POA: Diagnosis not present

## 2016-06-12 DIAGNOSIS — F339 Major depressive disorder, recurrent, unspecified: Secondary | ICD-10-CM | POA: Diagnosis not present

## 2016-06-12 DIAGNOSIS — I5022 Chronic systolic (congestive) heart failure: Secondary | ICD-10-CM | POA: Diagnosis not present

## 2016-06-12 DIAGNOSIS — N189 Chronic kidney disease, unspecified: Secondary | ICD-10-CM | POA: Diagnosis not present

## 2016-06-12 DIAGNOSIS — J449 Chronic obstructive pulmonary disease, unspecified: Secondary | ICD-10-CM | POA: Diagnosis not present

## 2016-06-12 DIAGNOSIS — I2510 Atherosclerotic heart disease of native coronary artery without angina pectoris: Secondary | ICD-10-CM | POA: Diagnosis not present

## 2016-06-18 DIAGNOSIS — I2510 Atherosclerotic heart disease of native coronary artery without angina pectoris: Secondary | ICD-10-CM | POA: Diagnosis not present

## 2016-06-18 DIAGNOSIS — N189 Chronic kidney disease, unspecified: Secondary | ICD-10-CM | POA: Diagnosis not present

## 2016-06-18 DIAGNOSIS — F339 Major depressive disorder, recurrent, unspecified: Secondary | ICD-10-CM | POA: Diagnosis not present

## 2016-06-18 DIAGNOSIS — I5022 Chronic systolic (congestive) heart failure: Secondary | ICD-10-CM | POA: Diagnosis not present

## 2016-06-18 DIAGNOSIS — I130 Hypertensive heart and chronic kidney disease with heart failure and stage 1 through stage 4 chronic kidney disease, or unspecified chronic kidney disease: Secondary | ICD-10-CM | POA: Diagnosis not present

## 2016-06-18 DIAGNOSIS — J449 Chronic obstructive pulmonary disease, unspecified: Secondary | ICD-10-CM | POA: Diagnosis not present

## 2016-06-19 DIAGNOSIS — J449 Chronic obstructive pulmonary disease, unspecified: Secondary | ICD-10-CM | POA: Diagnosis not present

## 2016-06-19 DIAGNOSIS — N189 Chronic kidney disease, unspecified: Secondary | ICD-10-CM | POA: Diagnosis not present

## 2016-06-19 DIAGNOSIS — I2510 Atherosclerotic heart disease of native coronary artery without angina pectoris: Secondary | ICD-10-CM | POA: Diagnosis not present

## 2016-06-19 DIAGNOSIS — I130 Hypertensive heart and chronic kidney disease with heart failure and stage 1 through stage 4 chronic kidney disease, or unspecified chronic kidney disease: Secondary | ICD-10-CM | POA: Diagnosis not present

## 2016-06-19 DIAGNOSIS — I5022 Chronic systolic (congestive) heart failure: Secondary | ICD-10-CM | POA: Diagnosis not present

## 2016-06-19 DIAGNOSIS — F339 Major depressive disorder, recurrent, unspecified: Secondary | ICD-10-CM | POA: Diagnosis not present

## 2016-06-24 DIAGNOSIS — I130 Hypertensive heart and chronic kidney disease with heart failure and stage 1 through stage 4 chronic kidney disease, or unspecified chronic kidney disease: Secondary | ICD-10-CM | POA: Diagnosis not present

## 2016-06-24 DIAGNOSIS — I2510 Atherosclerotic heart disease of native coronary artery without angina pectoris: Secondary | ICD-10-CM | POA: Diagnosis not present

## 2016-06-24 DIAGNOSIS — J449 Chronic obstructive pulmonary disease, unspecified: Secondary | ICD-10-CM | POA: Diagnosis not present

## 2016-06-24 DIAGNOSIS — F339 Major depressive disorder, recurrent, unspecified: Secondary | ICD-10-CM | POA: Diagnosis not present

## 2016-06-24 DIAGNOSIS — I5022 Chronic systolic (congestive) heart failure: Secondary | ICD-10-CM | POA: Diagnosis not present

## 2016-06-24 DIAGNOSIS — N189 Chronic kidney disease, unspecified: Secondary | ICD-10-CM | POA: Diagnosis not present

## 2016-07-02 DIAGNOSIS — F339 Major depressive disorder, recurrent, unspecified: Secondary | ICD-10-CM | POA: Diagnosis not present

## 2016-07-02 DIAGNOSIS — I5022 Chronic systolic (congestive) heart failure: Secondary | ICD-10-CM | POA: Diagnosis not present

## 2016-07-02 DIAGNOSIS — I2510 Atherosclerotic heart disease of native coronary artery without angina pectoris: Secondary | ICD-10-CM | POA: Diagnosis not present

## 2016-07-02 DIAGNOSIS — J449 Chronic obstructive pulmonary disease, unspecified: Secondary | ICD-10-CM | POA: Diagnosis not present

## 2016-07-02 DIAGNOSIS — I130 Hypertensive heart and chronic kidney disease with heart failure and stage 1 through stage 4 chronic kidney disease, or unspecified chronic kidney disease: Secondary | ICD-10-CM | POA: Diagnosis not present

## 2016-07-02 DIAGNOSIS — N189 Chronic kidney disease, unspecified: Secondary | ICD-10-CM | POA: Diagnosis not present

## 2016-07-11 DIAGNOSIS — I2510 Atherosclerotic heart disease of native coronary artery without angina pectoris: Secondary | ICD-10-CM | POA: Diagnosis not present

## 2016-07-11 DIAGNOSIS — F339 Major depressive disorder, recurrent, unspecified: Secondary | ICD-10-CM | POA: Diagnosis not present

## 2016-07-11 DIAGNOSIS — N189 Chronic kidney disease, unspecified: Secondary | ICD-10-CM | POA: Diagnosis not present

## 2016-07-11 DIAGNOSIS — J449 Chronic obstructive pulmonary disease, unspecified: Secondary | ICD-10-CM | POA: Diagnosis not present

## 2016-07-11 DIAGNOSIS — I130 Hypertensive heart and chronic kidney disease with heart failure and stage 1 through stage 4 chronic kidney disease, or unspecified chronic kidney disease: Secondary | ICD-10-CM | POA: Diagnosis not present

## 2016-07-11 DIAGNOSIS — I5022 Chronic systolic (congestive) heart failure: Secondary | ICD-10-CM | POA: Diagnosis not present

## 2016-07-14 DIAGNOSIS — I2510 Atherosclerotic heart disease of native coronary artery without angina pectoris: Secondary | ICD-10-CM | POA: Diagnosis not present

## 2016-07-14 DIAGNOSIS — F339 Major depressive disorder, recurrent, unspecified: Secondary | ICD-10-CM | POA: Diagnosis not present

## 2016-07-14 DIAGNOSIS — N189 Chronic kidney disease, unspecified: Secondary | ICD-10-CM | POA: Diagnosis not present

## 2016-07-14 DIAGNOSIS — J449 Chronic obstructive pulmonary disease, unspecified: Secondary | ICD-10-CM | POA: Diagnosis not present

## 2016-07-14 DIAGNOSIS — I130 Hypertensive heart and chronic kidney disease with heart failure and stage 1 through stage 4 chronic kidney disease, or unspecified chronic kidney disease: Secondary | ICD-10-CM | POA: Diagnosis not present

## 2016-07-14 DIAGNOSIS — I5022 Chronic systolic (congestive) heart failure: Secondary | ICD-10-CM | POA: Diagnosis not present

## 2016-07-16 DIAGNOSIS — F339 Major depressive disorder, recurrent, unspecified: Secondary | ICD-10-CM | POA: Diagnosis not present

## 2016-07-16 DIAGNOSIS — I5022 Chronic systolic (congestive) heart failure: Secondary | ICD-10-CM | POA: Diagnosis not present

## 2016-07-16 DIAGNOSIS — J449 Chronic obstructive pulmonary disease, unspecified: Secondary | ICD-10-CM | POA: Diagnosis not present

## 2016-07-16 DIAGNOSIS — I2510 Atherosclerotic heart disease of native coronary artery without angina pectoris: Secondary | ICD-10-CM | POA: Diagnosis not present

## 2016-07-16 DIAGNOSIS — N189 Chronic kidney disease, unspecified: Secondary | ICD-10-CM | POA: Diagnosis not present

## 2016-07-16 DIAGNOSIS — I130 Hypertensive heart and chronic kidney disease with heart failure and stage 1 through stage 4 chronic kidney disease, or unspecified chronic kidney disease: Secondary | ICD-10-CM | POA: Diagnosis not present

## 2016-07-23 DIAGNOSIS — F339 Major depressive disorder, recurrent, unspecified: Secondary | ICD-10-CM | POA: Diagnosis not present

## 2016-07-23 DIAGNOSIS — I2510 Atherosclerotic heart disease of native coronary artery without angina pectoris: Secondary | ICD-10-CM | POA: Diagnosis not present

## 2016-07-23 DIAGNOSIS — I5022 Chronic systolic (congestive) heart failure: Secondary | ICD-10-CM | POA: Diagnosis not present

## 2016-07-23 DIAGNOSIS — I130 Hypertensive heart and chronic kidney disease with heart failure and stage 1 through stage 4 chronic kidney disease, or unspecified chronic kidney disease: Secondary | ICD-10-CM | POA: Diagnosis not present

## 2016-07-23 DIAGNOSIS — N189 Chronic kidney disease, unspecified: Secondary | ICD-10-CM | POA: Diagnosis not present

## 2016-07-23 DIAGNOSIS — J449 Chronic obstructive pulmonary disease, unspecified: Secondary | ICD-10-CM | POA: Diagnosis not present

## 2016-07-29 DIAGNOSIS — N189 Chronic kidney disease, unspecified: Secondary | ICD-10-CM | POA: Diagnosis not present

## 2016-07-29 DIAGNOSIS — I5022 Chronic systolic (congestive) heart failure: Secondary | ICD-10-CM | POA: Diagnosis not present

## 2016-07-29 DIAGNOSIS — I2510 Atherosclerotic heart disease of native coronary artery without angina pectoris: Secondary | ICD-10-CM | POA: Diagnosis not present

## 2016-07-29 DIAGNOSIS — F339 Major depressive disorder, recurrent, unspecified: Secondary | ICD-10-CM | POA: Diagnosis not present

## 2016-07-29 DIAGNOSIS — I130 Hypertensive heart and chronic kidney disease with heart failure and stage 1 through stage 4 chronic kidney disease, or unspecified chronic kidney disease: Secondary | ICD-10-CM | POA: Diagnosis not present

## 2016-07-29 DIAGNOSIS — J449 Chronic obstructive pulmonary disease, unspecified: Secondary | ICD-10-CM | POA: Diagnosis not present

## 2016-07-30 DIAGNOSIS — N189 Chronic kidney disease, unspecified: Secondary | ICD-10-CM | POA: Diagnosis not present

## 2016-07-30 DIAGNOSIS — I5022 Chronic systolic (congestive) heart failure: Secondary | ICD-10-CM | POA: Diagnosis not present

## 2016-07-30 DIAGNOSIS — I130 Hypertensive heart and chronic kidney disease with heart failure and stage 1 through stage 4 chronic kidney disease, or unspecified chronic kidney disease: Secondary | ICD-10-CM | POA: Diagnosis not present

## 2016-07-30 DIAGNOSIS — J449 Chronic obstructive pulmonary disease, unspecified: Secondary | ICD-10-CM | POA: Diagnosis not present

## 2016-07-30 DIAGNOSIS — I2510 Atherosclerotic heart disease of native coronary artery without angina pectoris: Secondary | ICD-10-CM | POA: Diagnosis not present

## 2016-07-30 DIAGNOSIS — F339 Major depressive disorder, recurrent, unspecified: Secondary | ICD-10-CM | POA: Diagnosis not present

## 2016-12-11 DIAGNOSIS — I10 Essential (primary) hypertension: Secondary | ICD-10-CM | POA: Diagnosis not present

## 2016-12-11 DIAGNOSIS — R5383 Other fatigue: Secondary | ICD-10-CM | POA: Diagnosis not present

## 2016-12-11 DIAGNOSIS — R51 Headache: Secondary | ICD-10-CM | POA: Diagnosis not present

## 2016-12-12 DIAGNOSIS — R109 Unspecified abdominal pain: Secondary | ICD-10-CM | POA: Diagnosis not present

## 2016-12-12 DIAGNOSIS — F17210 Nicotine dependence, cigarettes, uncomplicated: Secondary | ICD-10-CM | POA: Diagnosis not present

## 2016-12-12 DIAGNOSIS — R0989 Other specified symptoms and signs involving the circulatory and respiratory systems: Secondary | ICD-10-CM | POA: Diagnosis not present

## 2016-12-12 DIAGNOSIS — Z955 Presence of coronary angioplasty implant and graft: Secondary | ICD-10-CM | POA: Diagnosis not present

## 2016-12-12 DIAGNOSIS — J189 Pneumonia, unspecified organism: Secondary | ICD-10-CM | POA: Diagnosis not present

## 2016-12-12 DIAGNOSIS — J449 Chronic obstructive pulmonary disease, unspecified: Secondary | ICD-10-CM | POA: Diagnosis not present

## 2016-12-12 DIAGNOSIS — I10 Essential (primary) hypertension: Secondary | ICD-10-CM | POA: Diagnosis not present

## 2016-12-12 DIAGNOSIS — I252 Old myocardial infarction: Secondary | ICD-10-CM | POA: Diagnosis not present

## 2016-12-12 DIAGNOSIS — Z79899 Other long term (current) drug therapy: Secondary | ICD-10-CM | POA: Diagnosis not present

## 2016-12-12 DIAGNOSIS — E875 Hyperkalemia: Secondary | ICD-10-CM | POA: Diagnosis not present

## 2016-12-12 DIAGNOSIS — Z951 Presence of aortocoronary bypass graft: Secondary | ICD-10-CM | POA: Diagnosis not present

## 2016-12-12 DIAGNOSIS — Z7982 Long term (current) use of aspirin: Secondary | ICD-10-CM | POA: Diagnosis not present

## 2017-04-09 DIAGNOSIS — M47816 Spondylosis without myelopathy or radiculopathy, lumbar region: Secondary | ICD-10-CM | POA: Diagnosis not present

## 2017-04-09 DIAGNOSIS — Z7982 Long term (current) use of aspirin: Secondary | ICD-10-CM | POA: Diagnosis not present

## 2017-04-09 DIAGNOSIS — Z955 Presence of coronary angioplasty implant and graft: Secondary | ICD-10-CM | POA: Diagnosis not present

## 2017-04-09 DIAGNOSIS — I2510 Atherosclerotic heart disease of native coronary artery without angina pectoris: Secondary | ICD-10-CM | POA: Diagnosis not present

## 2017-04-09 DIAGNOSIS — F17210 Nicotine dependence, cigarettes, uncomplicated: Secondary | ICD-10-CM | POA: Diagnosis not present

## 2017-04-09 DIAGNOSIS — Z299 Encounter for prophylactic measures, unspecified: Secondary | ICD-10-CM | POA: Diagnosis not present

## 2017-04-09 DIAGNOSIS — R55 Syncope and collapse: Secondary | ICD-10-CM | POA: Diagnosis not present

## 2017-04-09 DIAGNOSIS — I714 Abdominal aortic aneurysm, without rupture: Secondary | ICD-10-CM | POA: Diagnosis not present

## 2017-04-09 DIAGNOSIS — Z01811 Encounter for preprocedural respiratory examination: Secondary | ICD-10-CM | POA: Diagnosis not present

## 2017-04-09 DIAGNOSIS — E039 Hypothyroidism, unspecified: Secondary | ICD-10-CM | POA: Diagnosis not present

## 2017-04-09 DIAGNOSIS — S7292XA Unspecified fracture of left femur, initial encounter for closed fracture: Secondary | ICD-10-CM | POA: Diagnosis not present

## 2017-04-09 DIAGNOSIS — N183 Chronic kidney disease, stage 3 (moderate): Secondary | ICD-10-CM | POA: Diagnosis not present

## 2017-04-09 DIAGNOSIS — Z85118 Personal history of other malignant neoplasm of bronchus and lung: Secondary | ICD-10-CM | POA: Diagnosis not present

## 2017-04-09 DIAGNOSIS — J449 Chronic obstructive pulmonary disease, unspecified: Secondary | ICD-10-CM | POA: Diagnosis not present

## 2017-04-09 DIAGNOSIS — M25552 Pain in left hip: Secondary | ICD-10-CM | POA: Diagnosis not present

## 2017-04-09 DIAGNOSIS — Z043 Encounter for examination and observation following other accident: Secondary | ICD-10-CM | POA: Diagnosis not present

## 2017-04-09 DIAGNOSIS — I10 Essential (primary) hypertension: Secondary | ICD-10-CM | POA: Diagnosis not present

## 2017-04-09 DIAGNOSIS — Z79899 Other long term (current) drug therapy: Secondary | ICD-10-CM | POA: Diagnosis not present

## 2017-04-09 DIAGNOSIS — Z951 Presence of aortocoronary bypass graft: Secondary | ICD-10-CM | POA: Diagnosis not present

## 2017-04-09 DIAGNOSIS — I252 Old myocardial infarction: Secondary | ICD-10-CM | POA: Diagnosis not present

## 2017-04-09 DIAGNOSIS — S72002A Fracture of unspecified part of neck of left femur, initial encounter for closed fracture: Secondary | ICD-10-CM | POA: Diagnosis not present

## 2017-04-09 DIAGNOSIS — E785 Hyperlipidemia, unspecified: Secondary | ICD-10-CM | POA: Diagnosis not present

## 2017-04-09 DIAGNOSIS — I739 Peripheral vascular disease, unspecified: Secondary | ICD-10-CM | POA: Diagnosis not present

## 2017-04-09 DIAGNOSIS — S3992XA Unspecified injury of lower back, initial encounter: Secondary | ICD-10-CM | POA: Diagnosis not present

## 2017-04-09 DIAGNOSIS — S72012A Unspecified intracapsular fracture of left femur, initial encounter for closed fracture: Secondary | ICD-10-CM | POA: Diagnosis not present

## 2017-04-09 DIAGNOSIS — S72143A Displaced intertrochanteric fracture of unspecified femur, initial encounter for closed fracture: Secondary | ICD-10-CM | POA: Diagnosis not present

## 2017-04-09 DIAGNOSIS — W19XXXA Unspecified fall, initial encounter: Secondary | ICD-10-CM | POA: Diagnosis not present

## 2017-04-10 DIAGNOSIS — Z8249 Family history of ischemic heart disease and other diseases of the circulatory system: Secondary | ICD-10-CM | POA: Diagnosis not present

## 2017-04-10 DIAGNOSIS — Z833 Family history of diabetes mellitus: Secondary | ICD-10-CM | POA: Diagnosis not present

## 2017-04-10 DIAGNOSIS — E785 Hyperlipidemia, unspecified: Secondary | ICD-10-CM | POA: Diagnosis present

## 2017-04-10 DIAGNOSIS — I739 Peripheral vascular disease, unspecified: Secondary | ICD-10-CM | POA: Diagnosis present

## 2017-04-10 DIAGNOSIS — F17210 Nicotine dependence, cigarettes, uncomplicated: Secondary | ICD-10-CM | POA: Diagnosis present

## 2017-04-10 DIAGNOSIS — M84359S Stress fracture, hip, unspecified, sequela: Secondary | ICD-10-CM | POA: Diagnosis not present

## 2017-04-10 DIAGNOSIS — D62 Acute posthemorrhagic anemia: Secondary | ICD-10-CM | POA: Diagnosis not present

## 2017-04-10 DIAGNOSIS — R52 Pain, unspecified: Secondary | ICD-10-CM | POA: Diagnosis not present

## 2017-04-10 DIAGNOSIS — Z720 Tobacco use: Secondary | ICD-10-CM | POA: Diagnosis not present

## 2017-04-10 DIAGNOSIS — E861 Hypovolemia: Secondary | ICD-10-CM | POA: Diagnosis not present

## 2017-04-10 DIAGNOSIS — Z85118 Personal history of other malignant neoplasm of bronchus and lung: Secondary | ICD-10-CM | POA: Diagnosis not present

## 2017-04-10 DIAGNOSIS — S72009A Fracture of unspecified part of neck of unspecified femur, initial encounter for closed fracture: Secondary | ICD-10-CM | POA: Diagnosis not present

## 2017-04-10 DIAGNOSIS — J449 Chronic obstructive pulmonary disease, unspecified: Secondary | ICD-10-CM | POA: Diagnosis present

## 2017-04-10 DIAGNOSIS — E559 Vitamin D deficiency, unspecified: Secondary | ICD-10-CM | POA: Diagnosis not present

## 2017-04-10 DIAGNOSIS — N183 Chronic kidney disease, stage 3 (moderate): Secondary | ICD-10-CM | POA: Diagnosis present

## 2017-04-10 DIAGNOSIS — I214 Non-ST elevation (NSTEMI) myocardial infarction: Secondary | ICD-10-CM | POA: Diagnosis not present

## 2017-04-10 DIAGNOSIS — E039 Hypothyroidism, unspecified: Secondary | ICD-10-CM | POA: Diagnosis present

## 2017-04-10 DIAGNOSIS — S72012A Unspecified intracapsular fracture of left femur, initial encounter for closed fracture: Secondary | ICD-10-CM | POA: Diagnosis present

## 2017-04-10 DIAGNOSIS — R062 Wheezing: Secondary | ICD-10-CM | POA: Diagnosis not present

## 2017-04-10 DIAGNOSIS — I4510 Unspecified right bundle-branch block: Secondary | ICD-10-CM | POA: Diagnosis not present

## 2017-04-10 DIAGNOSIS — I129 Hypertensive chronic kidney disease with stage 1 through stage 4 chronic kidney disease, or unspecified chronic kidney disease: Secondary | ICD-10-CM | POA: Diagnosis present

## 2017-04-10 DIAGNOSIS — Z299 Encounter for prophylactic measures, unspecified: Secondary | ICD-10-CM | POA: Diagnosis not present

## 2017-04-10 DIAGNOSIS — S72002A Fracture of unspecified part of neck of left femur, initial encounter for closed fracture: Secondary | ICD-10-CM | POA: Diagnosis not present

## 2017-04-10 DIAGNOSIS — D649 Anemia, unspecified: Secondary | ICD-10-CM | POA: Diagnosis not present

## 2017-04-10 DIAGNOSIS — N179 Acute kidney failure, unspecified: Secondary | ICD-10-CM | POA: Diagnosis not present

## 2017-04-10 DIAGNOSIS — R1312 Dysphagia, oropharyngeal phase: Secondary | ICD-10-CM | POA: Diagnosis not present

## 2017-04-10 DIAGNOSIS — I10 Essential (primary) hypertension: Secondary | ICD-10-CM | POA: Diagnosis not present

## 2017-04-10 DIAGNOSIS — Z7901 Long term (current) use of anticoagulants: Secondary | ICD-10-CM | POA: Diagnosis not present

## 2017-04-10 DIAGNOSIS — S72009D Fracture of unspecified part of neck of unspecified femur, subsequent encounter for closed fracture with routine healing: Secondary | ICD-10-CM | POA: Diagnosis not present

## 2017-04-10 DIAGNOSIS — R489 Unspecified symbolic dysfunctions: Secondary | ICD-10-CM | POA: Diagnosis not present

## 2017-04-10 DIAGNOSIS — S72002D Fracture of unspecified part of neck of left femur, subsequent encounter for closed fracture with routine healing: Secondary | ICD-10-CM | POA: Diagnosis not present

## 2017-04-10 DIAGNOSIS — R1310 Dysphagia, unspecified: Secondary | ICD-10-CM | POA: Diagnosis not present

## 2017-04-10 DIAGNOSIS — I2510 Atherosclerotic heart disease of native coronary artery without angina pectoris: Secondary | ICD-10-CM | POA: Diagnosis present

## 2017-04-10 DIAGNOSIS — Z951 Presence of aortocoronary bypass graft: Secondary | ICD-10-CM | POA: Diagnosis not present

## 2017-04-10 DIAGNOSIS — Z885 Allergy status to narcotic agent status: Secondary | ICD-10-CM | POA: Diagnosis not present

## 2017-04-17 DIAGNOSIS — E559 Vitamin D deficiency, unspecified: Secondary | ICD-10-CM | POA: Diagnosis not present

## 2017-04-17 DIAGNOSIS — N179 Acute kidney failure, unspecified: Secondary | ICD-10-CM | POA: Diagnosis present

## 2017-04-17 DIAGNOSIS — I5041 Acute combined systolic (congestive) and diastolic (congestive) heart failure: Secondary | ICD-10-CM | POA: Diagnosis present

## 2017-04-17 DIAGNOSIS — Z720 Tobacco use: Secondary | ICD-10-CM | POA: Diagnosis not present

## 2017-04-17 DIAGNOSIS — F0390 Unspecified dementia without behavioral disturbance: Secondary | ICD-10-CM | POA: Diagnosis present

## 2017-04-17 DIAGNOSIS — J441 Chronic obstructive pulmonary disease with (acute) exacerbation: Secondary | ICD-10-CM | POA: Diagnosis present

## 2017-04-17 DIAGNOSIS — Z9981 Dependence on supplemental oxygen: Secondary | ICD-10-CM | POA: Diagnosis not present

## 2017-04-17 DIAGNOSIS — S72009A Fracture of unspecified part of neck of unspecified femur, initial encounter for closed fracture: Secondary | ICD-10-CM | POA: Diagnosis not present

## 2017-04-17 DIAGNOSIS — I214 Non-ST elevation (NSTEMI) myocardial infarction: Secondary | ICD-10-CM | POA: Diagnosis not present

## 2017-04-17 DIAGNOSIS — N183 Chronic kidney disease, stage 3 (moderate): Secondary | ICD-10-CM | POA: Diagnosis not present

## 2017-04-17 DIAGNOSIS — I10 Essential (primary) hypertension: Secondary | ICD-10-CM | POA: Diagnosis not present

## 2017-04-17 DIAGNOSIS — J90 Pleural effusion, not elsewhere classified: Secondary | ICD-10-CM | POA: Diagnosis not present

## 2017-04-17 DIAGNOSIS — I110 Hypertensive heart disease with heart failure: Secondary | ICD-10-CM | POA: Diagnosis not present

## 2017-04-17 DIAGNOSIS — K449 Diaphragmatic hernia without obstruction or gangrene: Secondary | ICD-10-CM | POA: Diagnosis present

## 2017-04-17 DIAGNOSIS — J449 Chronic obstructive pulmonary disease, unspecified: Secondary | ICD-10-CM | POA: Diagnosis not present

## 2017-04-17 DIAGNOSIS — I255 Ischemic cardiomyopathy: Secondary | ICD-10-CM | POA: Diagnosis present

## 2017-04-17 DIAGNOSIS — R339 Retention of urine, unspecified: Secondary | ICD-10-CM | POA: Diagnosis present

## 2017-04-17 DIAGNOSIS — I2510 Atherosclerotic heart disease of native coronary artery without angina pectoris: Secondary | ICD-10-CM | POA: Diagnosis present

## 2017-04-17 DIAGNOSIS — R918 Other nonspecific abnormal finding of lung field: Secondary | ICD-10-CM | POA: Diagnosis not present

## 2017-04-17 DIAGNOSIS — D6489 Other specified anemias: Secondary | ICD-10-CM | POA: Diagnosis not present

## 2017-04-17 DIAGNOSIS — E861 Hypovolemia: Secondary | ICD-10-CM | POA: Diagnosis not present

## 2017-04-17 DIAGNOSIS — R1312 Dysphagia, oropharyngeal phase: Secondary | ICD-10-CM | POA: Diagnosis not present

## 2017-04-17 DIAGNOSIS — Z951 Presence of aortocoronary bypass graft: Secondary | ICD-10-CM | POA: Diagnosis not present

## 2017-04-17 DIAGNOSIS — E875 Hyperkalemia: Secondary | ICD-10-CM | POA: Diagnosis present

## 2017-04-17 DIAGNOSIS — K2211 Ulcer of esophagus with bleeding: Secondary | ICD-10-CM | POA: Diagnosis present

## 2017-04-17 DIAGNOSIS — N189 Chronic kidney disease, unspecified: Secondary | ICD-10-CM | POA: Diagnosis present

## 2017-04-17 DIAGNOSIS — D500 Iron deficiency anemia secondary to blood loss (chronic): Secondary | ICD-10-CM | POA: Diagnosis present

## 2017-04-17 DIAGNOSIS — K2981 Duodenitis with bleeding: Secondary | ICD-10-CM | POA: Diagnosis present

## 2017-04-17 DIAGNOSIS — I509 Heart failure, unspecified: Secondary | ICD-10-CM | POA: Diagnosis not present

## 2017-04-17 DIAGNOSIS — J9600 Acute respiratory failure, unspecified whether with hypoxia or hypercapnia: Secondary | ICD-10-CM | POA: Diagnosis present

## 2017-04-17 DIAGNOSIS — R0603 Acute respiratory distress: Secondary | ICD-10-CM | POA: Diagnosis not present

## 2017-04-17 DIAGNOSIS — J811 Chronic pulmonary edema: Secondary | ICD-10-CM | POA: Diagnosis not present

## 2017-04-17 DIAGNOSIS — Z955 Presence of coronary angioplasty implant and graft: Secondary | ICD-10-CM | POA: Diagnosis not present

## 2017-04-17 DIAGNOSIS — Z136 Encounter for screening for cardiovascular disorders: Secondary | ICD-10-CM | POA: Diagnosis not present

## 2017-04-17 DIAGNOSIS — I130 Hypertensive heart and chronic kidney disease with heart failure and stage 1 through stage 4 chronic kidney disease, or unspecified chronic kidney disease: Secondary | ICD-10-CM | POA: Diagnosis present

## 2017-04-17 DIAGNOSIS — M84359S Stress fracture, hip, unspecified, sequela: Secondary | ICD-10-CM | POA: Diagnosis not present

## 2017-04-17 DIAGNOSIS — S72012A Unspecified intracapsular fracture of left femur, initial encounter for closed fracture: Secondary | ICD-10-CM | POA: Diagnosis not present

## 2017-04-17 DIAGNOSIS — E876 Hypokalemia: Secondary | ICD-10-CM | POA: Diagnosis not present

## 2017-04-17 DIAGNOSIS — I129 Hypertensive chronic kidney disease with stage 1 through stage 4 chronic kidney disease, or unspecified chronic kidney disease: Secondary | ICD-10-CM | POA: Diagnosis not present

## 2017-04-17 DIAGNOSIS — E785 Hyperlipidemia, unspecified: Secondary | ICD-10-CM | POA: Diagnosis present

## 2017-04-17 DIAGNOSIS — K2971 Gastritis, unspecified, with bleeding: Secondary | ICD-10-CM | POA: Diagnosis present

## 2017-04-17 DIAGNOSIS — R062 Wheezing: Secondary | ICD-10-CM | POA: Diagnosis not present

## 2017-04-17 DIAGNOSIS — Z471 Aftercare following joint replacement surgery: Secondary | ICD-10-CM | POA: Diagnosis not present

## 2017-04-17 DIAGNOSIS — E039 Hypothyroidism, unspecified: Secondary | ICD-10-CM | POA: Diagnosis present

## 2017-04-17 DIAGNOSIS — I21A1 Myocardial infarction type 2: Secondary | ICD-10-CM | POA: Diagnosis present

## 2017-04-17 DIAGNOSIS — Z85118 Personal history of other malignant neoplasm of bronchus and lung: Secondary | ICD-10-CM | POA: Diagnosis not present

## 2017-04-17 DIAGNOSIS — R489 Unspecified symbolic dysfunctions: Secondary | ICD-10-CM | POA: Diagnosis not present

## 2017-04-17 DIAGNOSIS — Z66 Do not resuscitate: Secondary | ICD-10-CM | POA: Diagnosis present

## 2017-04-17 DIAGNOSIS — R52 Pain, unspecified: Secondary | ICD-10-CM | POA: Diagnosis not present

## 2017-04-17 DIAGNOSIS — S72002D Fracture of unspecified part of neck of left femur, subsequent encounter for closed fracture with routine healing: Secondary | ICD-10-CM | POA: Diagnosis not present

## 2017-04-17 DIAGNOSIS — D649 Anemia, unspecified: Secondary | ICD-10-CM | POA: Diagnosis not present

## 2017-04-17 DIAGNOSIS — D62 Acute posthemorrhagic anemia: Secondary | ICD-10-CM | POA: Diagnosis not present

## 2017-04-17 DIAGNOSIS — R1310 Dysphagia, unspecified: Secondary | ICD-10-CM | POA: Diagnosis not present

## 2017-04-17 DIAGNOSIS — I739 Peripheral vascular disease, unspecified: Secondary | ICD-10-CM | POA: Diagnosis not present

## 2017-04-17 DIAGNOSIS — R140 Abdominal distension (gaseous): Secondary | ICD-10-CM | POA: Diagnosis not present

## 2017-04-17 DIAGNOSIS — S72009D Fracture of unspecified part of neck of unspecified femur, subsequent encounter for closed fracture with routine healing: Secondary | ICD-10-CM | POA: Diagnosis not present

## 2017-04-21 DIAGNOSIS — N183 Chronic kidney disease, stage 3 (moderate): Secondary | ICD-10-CM | POA: Diagnosis not present

## 2017-04-21 DIAGNOSIS — R140 Abdominal distension (gaseous): Secondary | ICD-10-CM | POA: Diagnosis not present

## 2017-04-21 DIAGNOSIS — J449 Chronic obstructive pulmonary disease, unspecified: Secondary | ICD-10-CM | POA: Diagnosis not present

## 2017-04-21 DIAGNOSIS — D6489 Other specified anemias: Secondary | ICD-10-CM | POA: Diagnosis not present

## 2017-04-24 DIAGNOSIS — I739 Peripheral vascular disease, unspecified: Secondary | ICD-10-CM | POA: Diagnosis not present

## 2017-04-24 DIAGNOSIS — Z9981 Dependence on supplemental oxygen: Secondary | ICD-10-CM | POA: Diagnosis not present

## 2017-04-24 DIAGNOSIS — S72002D Fracture of unspecified part of neck of left femur, subsequent encounter for closed fracture with routine healing: Secondary | ICD-10-CM | POA: Diagnosis not present

## 2017-04-24 DIAGNOSIS — D649 Anemia, unspecified: Secondary | ICD-10-CM | POA: Diagnosis not present

## 2017-04-24 DIAGNOSIS — I10 Essential (primary) hypertension: Secondary | ICD-10-CM | POA: Diagnosis not present

## 2017-04-24 DIAGNOSIS — J449 Chronic obstructive pulmonary disease, unspecified: Secondary | ICD-10-CM | POA: Diagnosis not present

## 2017-04-24 DIAGNOSIS — Z85118 Personal history of other malignant neoplasm of bronchus and lung: Secondary | ICD-10-CM | POA: Diagnosis not present

## 2017-04-24 DIAGNOSIS — I2510 Atherosclerotic heart disease of native coronary artery without angina pectoris: Secondary | ICD-10-CM | POA: Diagnosis not present

## 2017-04-24 DIAGNOSIS — N183 Chronic kidney disease, stage 3 (moderate): Secondary | ICD-10-CM | POA: Diagnosis not present

## 2017-04-28 DIAGNOSIS — Z85118 Personal history of other malignant neoplasm of bronchus and lung: Secondary | ICD-10-CM | POA: Diagnosis not present

## 2017-04-28 DIAGNOSIS — I2510 Atherosclerotic heart disease of native coronary artery without angina pectoris: Secondary | ICD-10-CM | POA: Diagnosis not present

## 2017-04-28 DIAGNOSIS — I10 Essential (primary) hypertension: Secondary | ICD-10-CM | POA: Diagnosis not present

## 2017-04-28 DIAGNOSIS — I739 Peripheral vascular disease, unspecified: Secondary | ICD-10-CM | POA: Diagnosis not present

## 2017-04-28 DIAGNOSIS — J449 Chronic obstructive pulmonary disease, unspecified: Secondary | ICD-10-CM | POA: Diagnosis not present

## 2017-04-28 DIAGNOSIS — N183 Chronic kidney disease, stage 3 (moderate): Secondary | ICD-10-CM | POA: Diagnosis not present

## 2017-04-28 DIAGNOSIS — S72002D Fracture of unspecified part of neck of left femur, subsequent encounter for closed fracture with routine healing: Secondary | ICD-10-CM | POA: Diagnosis not present

## 2017-04-28 DIAGNOSIS — Z9981 Dependence on supplemental oxygen: Secondary | ICD-10-CM | POA: Diagnosis not present

## 2017-04-28 DIAGNOSIS — D649 Anemia, unspecified: Secondary | ICD-10-CM | POA: Diagnosis not present

## 2017-04-30 DIAGNOSIS — J449 Chronic obstructive pulmonary disease, unspecified: Secondary | ICD-10-CM | POA: Diagnosis not present

## 2017-04-30 DIAGNOSIS — I10 Essential (primary) hypertension: Secondary | ICD-10-CM | POA: Diagnosis not present

## 2017-04-30 DIAGNOSIS — I739 Peripheral vascular disease, unspecified: Secondary | ICD-10-CM | POA: Diagnosis not present

## 2017-04-30 DIAGNOSIS — S72002D Fracture of unspecified part of neck of left femur, subsequent encounter for closed fracture with routine healing: Secondary | ICD-10-CM | POA: Diagnosis not present

## 2017-04-30 DIAGNOSIS — N183 Chronic kidney disease, stage 3 (moderate): Secondary | ICD-10-CM | POA: Diagnosis not present

## 2017-04-30 DIAGNOSIS — Z85118 Personal history of other malignant neoplasm of bronchus and lung: Secondary | ICD-10-CM | POA: Diagnosis not present

## 2017-04-30 DIAGNOSIS — D649 Anemia, unspecified: Secondary | ICD-10-CM | POA: Diagnosis not present

## 2017-04-30 DIAGNOSIS — Z9981 Dependence on supplemental oxygen: Secondary | ICD-10-CM | POA: Diagnosis not present

## 2017-05-01 DIAGNOSIS — S72002D Fracture of unspecified part of neck of left femur, subsequent encounter for closed fracture with routine healing: Secondary | ICD-10-CM | POA: Diagnosis not present

## 2017-05-11 DIAGNOSIS — D649 Anemia, unspecified: Secondary | ICD-10-CM | POA: Diagnosis not present

## 2017-05-11 DIAGNOSIS — I739 Peripheral vascular disease, unspecified: Secondary | ICD-10-CM | POA: Diagnosis not present

## 2017-05-11 DIAGNOSIS — N183 Chronic kidney disease, stage 3 (moderate): Secondary | ICD-10-CM | POA: Diagnosis not present

## 2017-05-11 DIAGNOSIS — S72002D Fracture of unspecified part of neck of left femur, subsequent encounter for closed fracture with routine healing: Secondary | ICD-10-CM | POA: Diagnosis not present

## 2017-05-11 DIAGNOSIS — J449 Chronic obstructive pulmonary disease, unspecified: Secondary | ICD-10-CM | POA: Diagnosis not present

## 2017-05-11 DIAGNOSIS — Z85118 Personal history of other malignant neoplasm of bronchus and lung: Secondary | ICD-10-CM | POA: Diagnosis not present

## 2017-05-11 DIAGNOSIS — E876 Hypokalemia: Secondary | ICD-10-CM | POA: Diagnosis not present

## 2017-05-11 DIAGNOSIS — Z9981 Dependence on supplemental oxygen: Secondary | ICD-10-CM | POA: Diagnosis not present

## 2017-05-11 DIAGNOSIS — I10 Essential (primary) hypertension: Secondary | ICD-10-CM | POA: Diagnosis not present

## 2017-05-11 DIAGNOSIS — I2510 Atherosclerotic heart disease of native coronary artery without angina pectoris: Secondary | ICD-10-CM | POA: Diagnosis not present

## 2017-05-14 DIAGNOSIS — N183 Chronic kidney disease, stage 3 (moderate): Secondary | ICD-10-CM | POA: Diagnosis not present

## 2017-05-14 DIAGNOSIS — D649 Anemia, unspecified: Secondary | ICD-10-CM | POA: Diagnosis not present

## 2017-05-15 DIAGNOSIS — N183 Chronic kidney disease, stage 3 (moderate): Secondary | ICD-10-CM | POA: Diagnosis not present

## 2017-05-15 DIAGNOSIS — D649 Anemia, unspecified: Secondary | ICD-10-CM | POA: Diagnosis not present

## 2017-05-16 DIAGNOSIS — J811 Chronic pulmonary edema: Secondary | ICD-10-CM | POA: Diagnosis not present

## 2017-05-16 DIAGNOSIS — J9600 Acute respiratory failure, unspecified whether with hypoxia or hypercapnia: Secondary | ICD-10-CM | POA: Diagnosis not present

## 2017-05-16 DIAGNOSIS — N179 Acute kidney failure, unspecified: Secondary | ICD-10-CM | POA: Diagnosis not present

## 2017-05-16 DIAGNOSIS — I5041 Acute combined systolic (congestive) and diastolic (congestive) heart failure: Secondary | ICD-10-CM | POA: Diagnosis not present

## 2017-05-16 DIAGNOSIS — I110 Hypertensive heart disease with heart failure: Secondary | ICD-10-CM | POA: Diagnosis not present

## 2017-05-16 DIAGNOSIS — N189 Chronic kidney disease, unspecified: Secondary | ICD-10-CM | POA: Diagnosis not present

## 2017-05-16 DIAGNOSIS — K2211 Ulcer of esophagus with bleeding: Secondary | ICD-10-CM | POA: Diagnosis not present

## 2017-05-16 DIAGNOSIS — K2971 Gastritis, unspecified, with bleeding: Secondary | ICD-10-CM | POA: Diagnosis not present

## 2017-05-16 DIAGNOSIS — K2981 Duodenitis with bleeding: Secondary | ICD-10-CM | POA: Diagnosis not present

## 2017-05-16 DIAGNOSIS — I21A1 Myocardial infarction type 2: Secondary | ICD-10-CM | POA: Diagnosis not present

## 2017-05-16 DIAGNOSIS — I130 Hypertensive heart and chronic kidney disease with heart failure and stage 1 through stage 4 chronic kidney disease, or unspecified chronic kidney disease: Secondary | ICD-10-CM | POA: Diagnosis not present

## 2017-05-16 DIAGNOSIS — D649 Anemia, unspecified: Secondary | ICD-10-CM | POA: Diagnosis not present

## 2017-05-16 DIAGNOSIS — J90 Pleural effusion, not elsewhere classified: Secondary | ICD-10-CM | POA: Diagnosis not present

## 2017-05-16 DIAGNOSIS — I509 Heart failure, unspecified: Secondary | ICD-10-CM | POA: Diagnosis not present

## 2017-05-16 DIAGNOSIS — J441 Chronic obstructive pulmonary disease with (acute) exacerbation: Secondary | ICD-10-CM | POA: Diagnosis not present

## 2017-05-17 DIAGNOSIS — Z955 Presence of coronary angioplasty implant and graft: Secondary | ICD-10-CM | POA: Diagnosis not present

## 2017-05-17 DIAGNOSIS — K449 Diaphragmatic hernia without obstruction or gangrene: Secondary | ICD-10-CM | POA: Diagnosis present

## 2017-05-17 DIAGNOSIS — R489 Unspecified symbolic dysfunctions: Secondary | ICD-10-CM | POA: Diagnosis not present

## 2017-05-17 DIAGNOSIS — J181 Lobar pneumonia, unspecified organism: Secondary | ICD-10-CM | POA: Diagnosis not present

## 2017-05-17 DIAGNOSIS — J811 Chronic pulmonary edema: Secondary | ICD-10-CM | POA: Diagnosis not present

## 2017-05-17 DIAGNOSIS — N189 Chronic kidney disease, unspecified: Secondary | ICD-10-CM | POA: Diagnosis present

## 2017-05-17 DIAGNOSIS — I255 Ischemic cardiomyopathy: Secondary | ICD-10-CM | POA: Diagnosis present

## 2017-05-17 DIAGNOSIS — E875 Hyperkalemia: Secondary | ICD-10-CM | POA: Diagnosis present

## 2017-05-17 DIAGNOSIS — Z720 Tobacco use: Secondary | ICD-10-CM | POA: Diagnosis not present

## 2017-05-17 DIAGNOSIS — E559 Vitamin D deficiency, unspecified: Secondary | ICD-10-CM | POA: Diagnosis not present

## 2017-05-17 DIAGNOSIS — K2971 Gastritis, unspecified, with bleeding: Secondary | ICD-10-CM | POA: Diagnosis present

## 2017-05-17 DIAGNOSIS — I21A1 Myocardial infarction type 2: Secondary | ICD-10-CM | POA: Diagnosis present

## 2017-05-17 DIAGNOSIS — K2210 Ulcer of esophagus without bleeding: Secondary | ICD-10-CM | POA: Diagnosis not present

## 2017-05-17 DIAGNOSIS — K2981 Duodenitis with bleeding: Secondary | ICD-10-CM | POA: Diagnosis present

## 2017-05-17 DIAGNOSIS — R1312 Dysphagia, oropharyngeal phase: Secondary | ICD-10-CM | POA: Diagnosis not present

## 2017-05-17 DIAGNOSIS — I509 Heart failure, unspecified: Secondary | ICD-10-CM | POA: Diagnosis not present

## 2017-05-17 DIAGNOSIS — Z66 Do not resuscitate: Secondary | ICD-10-CM | POA: Diagnosis present

## 2017-05-17 DIAGNOSIS — E039 Hypothyroidism, unspecified: Secondary | ICD-10-CM | POA: Diagnosis present

## 2017-05-17 DIAGNOSIS — F0390 Unspecified dementia without behavioral disturbance: Secondary | ICD-10-CM | POA: Diagnosis present

## 2017-05-17 DIAGNOSIS — N183 Chronic kidney disease, stage 3 (moderate): Secondary | ICD-10-CM | POA: Diagnosis not present

## 2017-05-17 DIAGNOSIS — K2970 Gastritis, unspecified, without bleeding: Secondary | ICD-10-CM | POA: Diagnosis not present

## 2017-05-17 DIAGNOSIS — I2510 Atherosclerotic heart disease of native coronary artery without angina pectoris: Secondary | ICD-10-CM | POA: Diagnosis present

## 2017-05-17 DIAGNOSIS — J9600 Acute respiratory failure, unspecified whether with hypoxia or hypercapnia: Secondary | ICD-10-CM | POA: Diagnosis present

## 2017-05-17 DIAGNOSIS — I10 Essential (primary) hypertension: Secondary | ICD-10-CM | POA: Diagnosis not present

## 2017-05-17 DIAGNOSIS — K2211 Ulcer of esophagus with bleeding: Secondary | ICD-10-CM | POA: Diagnosis present

## 2017-05-17 DIAGNOSIS — I214 Non-ST elevation (NSTEMI) myocardial infarction: Secondary | ICD-10-CM | POA: Diagnosis not present

## 2017-05-17 DIAGNOSIS — Z85118 Personal history of other malignant neoplasm of bronchus and lung: Secondary | ICD-10-CM | POA: Diagnosis not present

## 2017-05-17 DIAGNOSIS — E785 Hyperlipidemia, unspecified: Secondary | ICD-10-CM | POA: Diagnosis present

## 2017-05-17 DIAGNOSIS — J449 Chronic obstructive pulmonary disease, unspecified: Secondary | ICD-10-CM | POA: Diagnosis not present

## 2017-05-17 DIAGNOSIS — D500 Iron deficiency anemia secondary to blood loss (chronic): Secondary | ICD-10-CM | POA: Diagnosis present

## 2017-05-17 DIAGNOSIS — I130 Hypertensive heart and chronic kidney disease with heart failure and stage 1 through stage 4 chronic kidney disease, or unspecified chronic kidney disease: Secondary | ICD-10-CM | POA: Diagnosis present

## 2017-05-17 DIAGNOSIS — R531 Weakness: Secondary | ICD-10-CM | POA: Diagnosis not present

## 2017-05-17 DIAGNOSIS — N281 Cyst of kidney, acquired: Secondary | ICD-10-CM | POA: Diagnosis not present

## 2017-05-17 DIAGNOSIS — I340 Nonrheumatic mitral (valve) insufficiency: Secondary | ICD-10-CM | POA: Diagnosis not present

## 2017-05-17 DIAGNOSIS — J441 Chronic obstructive pulmonary disease with (acute) exacerbation: Secondary | ICD-10-CM | POA: Diagnosis present

## 2017-05-17 DIAGNOSIS — D649 Anemia, unspecified: Secondary | ICD-10-CM | POA: Diagnosis not present

## 2017-05-17 DIAGNOSIS — S72002D Fracture of unspecified part of neck of left femur, subsequent encounter for closed fracture with routine healing: Secondary | ICD-10-CM | POA: Diagnosis not present

## 2017-05-17 DIAGNOSIS — I739 Peripheral vascular disease, unspecified: Secondary | ICD-10-CM | POA: Diagnosis not present

## 2017-05-17 DIAGNOSIS — N179 Acute kidney failure, unspecified: Secondary | ICD-10-CM | POA: Diagnosis present

## 2017-05-17 DIAGNOSIS — R339 Retention of urine, unspecified: Secondary | ICD-10-CM | POA: Diagnosis present

## 2017-05-17 DIAGNOSIS — Z951 Presence of aortocoronary bypass graft: Secondary | ICD-10-CM | POA: Diagnosis not present

## 2017-05-17 DIAGNOSIS — I5041 Acute combined systolic (congestive) and diastolic (congestive) heart failure: Secondary | ICD-10-CM | POA: Diagnosis present

## 2017-05-24 DIAGNOSIS — I214 Non-ST elevation (NSTEMI) myocardial infarction: Secondary | ICD-10-CM | POA: Diagnosis not present

## 2017-05-24 DIAGNOSIS — E039 Hypothyroidism, unspecified: Secondary | ICD-10-CM | POA: Diagnosis not present

## 2017-05-24 DIAGNOSIS — R531 Weakness: Secondary | ICD-10-CM | POA: Diagnosis not present

## 2017-05-24 DIAGNOSIS — S0181XA Laceration without foreign body of other part of head, initial encounter: Secondary | ICD-10-CM | POA: Diagnosis not present

## 2017-05-24 DIAGNOSIS — S065X0A Traumatic subdural hemorrhage without loss of consciousness, initial encounter: Secondary | ICD-10-CM | POA: Diagnosis not present

## 2017-05-24 DIAGNOSIS — I130 Hypertensive heart and chronic kidney disease with heart failure and stage 1 through stage 4 chronic kidney disease, or unspecified chronic kidney disease: Secondary | ICD-10-CM | POA: Diagnosis not present

## 2017-05-24 DIAGNOSIS — N179 Acute kidney failure, unspecified: Secondary | ICD-10-CM | POA: Diagnosis not present

## 2017-05-24 DIAGNOSIS — J9600 Acute respiratory failure, unspecified whether with hypoxia or hypercapnia: Secondary | ICD-10-CM | POA: Diagnosis not present

## 2017-05-24 DIAGNOSIS — Z955 Presence of coronary angioplasty implant and graft: Secondary | ICD-10-CM | POA: Diagnosis not present

## 2017-05-24 DIAGNOSIS — Z720 Tobacco use: Secondary | ICD-10-CM | POA: Diagnosis not present

## 2017-05-24 DIAGNOSIS — D649 Anemia, unspecified: Secondary | ICD-10-CM | POA: Diagnosis not present

## 2017-05-24 DIAGNOSIS — M542 Cervicalgia: Secondary | ICD-10-CM | POA: Diagnosis not present

## 2017-05-24 DIAGNOSIS — I509 Heart failure, unspecified: Secondary | ICD-10-CM | POA: Diagnosis not present

## 2017-05-24 DIAGNOSIS — E559 Vitamin D deficiency, unspecified: Secondary | ICD-10-CM | POA: Diagnosis not present

## 2017-05-24 DIAGNOSIS — I110 Hypertensive heart disease with heart failure: Secondary | ICD-10-CM | POA: Diagnosis not present

## 2017-05-24 DIAGNOSIS — E785 Hyperlipidemia, unspecified: Secondary | ICD-10-CM | POA: Diagnosis not present

## 2017-05-24 DIAGNOSIS — Z85118 Personal history of other malignant neoplasm of bronchus and lung: Secondary | ICD-10-CM | POA: Diagnosis not present

## 2017-05-24 DIAGNOSIS — R609 Edema, unspecified: Secondary | ICD-10-CM | POA: Diagnosis not present

## 2017-05-24 DIAGNOSIS — I739 Peripheral vascular disease, unspecified: Secondary | ICD-10-CM | POA: Diagnosis not present

## 2017-05-24 DIAGNOSIS — J449 Chronic obstructive pulmonary disease, unspecified: Secondary | ICD-10-CM | POA: Diagnosis not present

## 2017-05-24 DIAGNOSIS — I5089 Other heart failure: Secondary | ICD-10-CM | POA: Diagnosis not present

## 2017-05-24 DIAGNOSIS — S0993XA Unspecified injury of face, initial encounter: Secondary | ICD-10-CM | POA: Diagnosis not present

## 2017-05-24 DIAGNOSIS — R1312 Dysphagia, oropharyngeal phase: Secondary | ICD-10-CM | POA: Diagnosis not present

## 2017-05-24 DIAGNOSIS — I10 Essential (primary) hypertension: Secondary | ICD-10-CM | POA: Diagnosis not present

## 2017-05-24 DIAGNOSIS — S72002D Fracture of unspecified part of neck of left femur, subsequent encounter for closed fracture with routine healing: Secondary | ICD-10-CM | POA: Diagnosis not present

## 2017-05-24 DIAGNOSIS — N183 Chronic kidney disease, stage 3 (moderate): Secondary | ICD-10-CM | POA: Diagnosis not present

## 2017-05-24 DIAGNOSIS — Z7982 Long term (current) use of aspirin: Secondary | ICD-10-CM | POA: Diagnosis not present

## 2017-05-24 DIAGNOSIS — I2510 Atherosclerotic heart disease of native coronary artery without angina pectoris: Secondary | ICD-10-CM | POA: Diagnosis not present

## 2017-05-24 DIAGNOSIS — R339 Retention of urine, unspecified: Secondary | ICD-10-CM | POA: Diagnosis not present

## 2017-05-24 DIAGNOSIS — I252 Old myocardial infarction: Secondary | ICD-10-CM | POA: Diagnosis not present

## 2017-05-24 DIAGNOSIS — Z951 Presence of aortocoronary bypass graft: Secondary | ICD-10-CM | POA: Diagnosis not present

## 2017-05-24 DIAGNOSIS — Z79899 Other long term (current) drug therapy: Secondary | ICD-10-CM | POA: Diagnosis not present

## 2017-05-24 DIAGNOSIS — R489 Unspecified symbolic dysfunctions: Secondary | ICD-10-CM | POA: Diagnosis not present

## 2017-05-24 DIAGNOSIS — E875 Hyperkalemia: Secondary | ICD-10-CM | POA: Diagnosis not present

## 2017-05-24 DIAGNOSIS — W19XXXA Unspecified fall, initial encounter: Secondary | ICD-10-CM | POA: Diagnosis not present

## 2017-05-24 DIAGNOSIS — Z23 Encounter for immunization: Secondary | ICD-10-CM | POA: Diagnosis not present

## 2017-05-24 DIAGNOSIS — J441 Chronic obstructive pulmonary disease with (acute) exacerbation: Secondary | ICD-10-CM | POA: Diagnosis not present

## 2017-05-26 DIAGNOSIS — I10 Essential (primary) hypertension: Secondary | ICD-10-CM | POA: Diagnosis not present

## 2017-05-26 DIAGNOSIS — I739 Peripheral vascular disease, unspecified: Secondary | ICD-10-CM | POA: Diagnosis not present

## 2017-05-26 DIAGNOSIS — D649 Anemia, unspecified: Secondary | ICD-10-CM | POA: Diagnosis not present

## 2017-05-26 DIAGNOSIS — N183 Chronic kidney disease, stage 3 (moderate): Secondary | ICD-10-CM | POA: Diagnosis not present

## 2017-05-26 DIAGNOSIS — I5089 Other heart failure: Secondary | ICD-10-CM | POA: Diagnosis not present

## 2017-05-26 DIAGNOSIS — E039 Hypothyroidism, unspecified: Secondary | ICD-10-CM | POA: Diagnosis not present

## 2017-05-26 DIAGNOSIS — J449 Chronic obstructive pulmonary disease, unspecified: Secondary | ICD-10-CM | POA: Diagnosis not present

## 2017-05-26 DIAGNOSIS — R609 Edema, unspecified: Secondary | ICD-10-CM | POA: Diagnosis not present

## 2017-05-26 DIAGNOSIS — Z85118 Personal history of other malignant neoplasm of bronchus and lung: Secondary | ICD-10-CM | POA: Diagnosis not present

## 2017-05-30 DIAGNOSIS — M542 Cervicalgia: Secondary | ICD-10-CM | POA: Diagnosis not present

## 2017-05-30 DIAGNOSIS — S0993XA Unspecified injury of face, initial encounter: Secondary | ICD-10-CM | POA: Diagnosis not present

## 2017-05-30 DIAGNOSIS — I739 Peripheral vascular disease, unspecified: Secondary | ICD-10-CM | POA: Diagnosis not present

## 2017-05-30 DIAGNOSIS — S065X0A Traumatic subdural hemorrhage without loss of consciousness, initial encounter: Secondary | ICD-10-CM | POA: Diagnosis not present

## 2017-05-30 DIAGNOSIS — I110 Hypertensive heart disease with heart failure: Secondary | ICD-10-CM | POA: Diagnosis not present

## 2017-05-30 DIAGNOSIS — W19XXXA Unspecified fall, initial encounter: Secondary | ICD-10-CM | POA: Diagnosis not present

## 2017-05-30 DIAGNOSIS — I252 Old myocardial infarction: Secondary | ICD-10-CM | POA: Diagnosis not present

## 2017-05-30 DIAGNOSIS — I509 Heart failure, unspecified: Secondary | ICD-10-CM | POA: Diagnosis not present

## 2017-05-30 DIAGNOSIS — I2510 Atherosclerotic heart disease of native coronary artery without angina pectoris: Secondary | ICD-10-CM | POA: Diagnosis not present

## 2017-05-31 DIAGNOSIS — S065X0A Traumatic subdural hemorrhage without loss of consciousness, initial encounter: Secondary | ICD-10-CM | POA: Diagnosis not present

## 2017-06-06 DIAGNOSIS — E785 Hyperlipidemia, unspecified: Secondary | ICD-10-CM | POA: Diagnosis not present

## 2017-06-06 DIAGNOSIS — Z9981 Dependence on supplemental oxygen: Secondary | ICD-10-CM | POA: Diagnosis not present

## 2017-06-06 DIAGNOSIS — I130 Hypertensive heart and chronic kidney disease with heart failure and stage 1 through stage 4 chronic kidney disease, or unspecified chronic kidney disease: Secondary | ICD-10-CM | POA: Diagnosis not present

## 2017-06-06 DIAGNOSIS — I2510 Atherosclerotic heart disease of native coronary artery without angina pectoris: Secondary | ICD-10-CM | POA: Diagnosis not present

## 2017-06-06 DIAGNOSIS — I714 Abdominal aortic aneurysm, without rupture: Secondary | ICD-10-CM | POA: Diagnosis not present

## 2017-06-06 DIAGNOSIS — J449 Chronic obstructive pulmonary disease, unspecified: Secondary | ICD-10-CM | POA: Diagnosis not present

## 2017-06-06 DIAGNOSIS — Z7409 Other reduced mobility: Secondary | ICD-10-CM | POA: Diagnosis not present

## 2017-06-06 DIAGNOSIS — I509 Heart failure, unspecified: Secondary | ICD-10-CM | POA: Diagnosis not present

## 2017-06-06 DIAGNOSIS — F0390 Unspecified dementia without behavioral disturbance: Secondary | ICD-10-CM | POA: Diagnosis not present

## 2017-06-06 DIAGNOSIS — S065X0D Traumatic subdural hemorrhage without loss of consciousness, subsequent encounter: Secondary | ICD-10-CM | POA: Diagnosis not present

## 2017-06-06 DIAGNOSIS — N183 Chronic kidney disease, stage 3 (moderate): Secondary | ICD-10-CM | POA: Diagnosis not present

## 2017-06-06 DIAGNOSIS — S72009D Fracture of unspecified part of neck of unspecified femur, subsequent encounter for closed fracture with routine healing: Secondary | ICD-10-CM | POA: Diagnosis not present

## 2017-06-07 DIAGNOSIS — I509 Heart failure, unspecified: Secondary | ICD-10-CM | POA: Diagnosis not present

## 2017-06-07 DIAGNOSIS — I2510 Atherosclerotic heart disease of native coronary artery without angina pectoris: Secondary | ICD-10-CM | POA: Diagnosis not present

## 2017-06-07 DIAGNOSIS — S72009D Fracture of unspecified part of neck of unspecified femur, subsequent encounter for closed fracture with routine healing: Secondary | ICD-10-CM | POA: Diagnosis not present

## 2017-06-07 DIAGNOSIS — I714 Abdominal aortic aneurysm, without rupture: Secondary | ICD-10-CM | POA: Diagnosis not present

## 2017-06-07 DIAGNOSIS — S065X0D Traumatic subdural hemorrhage without loss of consciousness, subsequent encounter: Secondary | ICD-10-CM | POA: Diagnosis not present

## 2017-06-07 DIAGNOSIS — I130 Hypertensive heart and chronic kidney disease with heart failure and stage 1 through stage 4 chronic kidney disease, or unspecified chronic kidney disease: Secondary | ICD-10-CM | POA: Diagnosis not present

## 2017-06-09 DIAGNOSIS — S72009D Fracture of unspecified part of neck of unspecified femur, subsequent encounter for closed fracture with routine healing: Secondary | ICD-10-CM | POA: Diagnosis not present

## 2017-06-09 DIAGNOSIS — I714 Abdominal aortic aneurysm, without rupture: Secondary | ICD-10-CM | POA: Diagnosis not present

## 2017-06-09 DIAGNOSIS — I130 Hypertensive heart and chronic kidney disease with heart failure and stage 1 through stage 4 chronic kidney disease, or unspecified chronic kidney disease: Secondary | ICD-10-CM | POA: Diagnosis not present

## 2017-06-09 DIAGNOSIS — S065X0D Traumatic subdural hemorrhage without loss of consciousness, subsequent encounter: Secondary | ICD-10-CM | POA: Diagnosis not present

## 2017-06-09 DIAGNOSIS — I509 Heart failure, unspecified: Secondary | ICD-10-CM | POA: Diagnosis not present

## 2017-06-09 DIAGNOSIS — I2510 Atherosclerotic heart disease of native coronary artery without angina pectoris: Secondary | ICD-10-CM | POA: Diagnosis not present

## 2017-06-10 DIAGNOSIS — S065X0D Traumatic subdural hemorrhage without loss of consciousness, subsequent encounter: Secondary | ICD-10-CM | POA: Diagnosis not present

## 2017-06-10 DIAGNOSIS — I714 Abdominal aortic aneurysm, without rupture: Secondary | ICD-10-CM | POA: Diagnosis not present

## 2017-06-10 DIAGNOSIS — I509 Heart failure, unspecified: Secondary | ICD-10-CM | POA: Diagnosis not present

## 2017-06-10 DIAGNOSIS — S72009D Fracture of unspecified part of neck of unspecified femur, subsequent encounter for closed fracture with routine healing: Secondary | ICD-10-CM | POA: Diagnosis not present

## 2017-06-10 DIAGNOSIS — I2510 Atherosclerotic heart disease of native coronary artery without angina pectoris: Secondary | ICD-10-CM | POA: Diagnosis not present

## 2017-06-10 DIAGNOSIS — I130 Hypertensive heart and chronic kidney disease with heart failure and stage 1 through stage 4 chronic kidney disease, or unspecified chronic kidney disease: Secondary | ICD-10-CM | POA: Diagnosis not present

## 2017-06-11 DIAGNOSIS — I2510 Atherosclerotic heart disease of native coronary artery without angina pectoris: Secondary | ICD-10-CM | POA: Diagnosis not present

## 2017-06-11 DIAGNOSIS — I509 Heart failure, unspecified: Secondary | ICD-10-CM | POA: Diagnosis not present

## 2017-06-11 DIAGNOSIS — S72009D Fracture of unspecified part of neck of unspecified femur, subsequent encounter for closed fracture with routine healing: Secondary | ICD-10-CM | POA: Diagnosis not present

## 2017-06-11 DIAGNOSIS — S065X0D Traumatic subdural hemorrhage without loss of consciousness, subsequent encounter: Secondary | ICD-10-CM | POA: Diagnosis not present

## 2017-06-11 DIAGNOSIS — I714 Abdominal aortic aneurysm, without rupture: Secondary | ICD-10-CM | POA: Diagnosis not present

## 2017-06-11 DIAGNOSIS — I130 Hypertensive heart and chronic kidney disease with heart failure and stage 1 through stage 4 chronic kidney disease, or unspecified chronic kidney disease: Secondary | ICD-10-CM | POA: Diagnosis not present

## 2017-06-12 DIAGNOSIS — I2510 Atherosclerotic heart disease of native coronary artery without angina pectoris: Secondary | ICD-10-CM | POA: Diagnosis not present

## 2017-06-12 DIAGNOSIS — S72009D Fracture of unspecified part of neck of unspecified femur, subsequent encounter for closed fracture with routine healing: Secondary | ICD-10-CM | POA: Diagnosis not present

## 2017-06-12 DIAGNOSIS — S065X0D Traumatic subdural hemorrhage without loss of consciousness, subsequent encounter: Secondary | ICD-10-CM | POA: Diagnosis not present

## 2017-06-12 DIAGNOSIS — I130 Hypertensive heart and chronic kidney disease with heart failure and stage 1 through stage 4 chronic kidney disease, or unspecified chronic kidney disease: Secondary | ICD-10-CM | POA: Diagnosis not present

## 2017-06-12 DIAGNOSIS — I509 Heart failure, unspecified: Secondary | ICD-10-CM | POA: Diagnosis not present

## 2017-06-12 DIAGNOSIS — I714 Abdominal aortic aneurysm, without rupture: Secondary | ICD-10-CM | POA: Diagnosis not present

## 2017-06-13 DIAGNOSIS — I130 Hypertensive heart and chronic kidney disease with heart failure and stage 1 through stage 4 chronic kidney disease, or unspecified chronic kidney disease: Secondary | ICD-10-CM | POA: Diagnosis not present

## 2017-06-13 DIAGNOSIS — S065X0D Traumatic subdural hemorrhage without loss of consciousness, subsequent encounter: Secondary | ICD-10-CM | POA: Diagnosis not present

## 2017-06-13 DIAGNOSIS — S72009D Fracture of unspecified part of neck of unspecified femur, subsequent encounter for closed fracture with routine healing: Secondary | ICD-10-CM | POA: Diagnosis not present

## 2017-06-13 DIAGNOSIS — I509 Heart failure, unspecified: Secondary | ICD-10-CM | POA: Diagnosis not present

## 2017-06-13 DIAGNOSIS — I2510 Atherosclerotic heart disease of native coronary artery without angina pectoris: Secondary | ICD-10-CM | POA: Diagnosis not present

## 2017-06-13 DIAGNOSIS — I714 Abdominal aortic aneurysm, without rupture: Secondary | ICD-10-CM | POA: Diagnosis not present

## 2017-07-06 DEATH — deceased
